# Patient Record
Sex: Male | Born: 1935 | Race: White | Hispanic: No | Smoking: Never smoker
Health system: Southern US, Community
[De-identification: ages and names within clinical notes are randomized; demographics above are authoritative.]

## PROBLEM LIST (undated history)

## (undated) DIAGNOSIS — E7800 Pure hypercholesterolemia, unspecified: Secondary | ICD-10-CM

## (undated) DIAGNOSIS — M7989 Other specified soft tissue disorders: Secondary | ICD-10-CM

## (undated) DIAGNOSIS — I10 Essential (primary) hypertension: Secondary | ICD-10-CM

## (undated) DIAGNOSIS — M1990 Unspecified osteoarthritis, unspecified site: Secondary | ICD-10-CM

## (undated) DIAGNOSIS — C419 Malignant neoplasm of bone and articular cartilage, unspecified: Secondary | ICD-10-CM

## (undated) DIAGNOSIS — I519 Heart disease, unspecified: Secondary | ICD-10-CM

## (undated) HISTORY — PX: CHOLECYSTECTOMY: SHX55

## (undated) HISTORY — DX: Unspecified osteoarthritis, unspecified site: M19.90

## (undated) HISTORY — DX: Essential (primary) hypertension: I10

## (undated) HISTORY — DX: Heart disease, unspecified: I51.9

## (undated) HISTORY — DX: Malignant neoplasm of bone and articular cartilage, unspecified: C41.9

## (undated) HISTORY — DX: Pure hypercholesterolemia, unspecified: E78.00

## (undated) HISTORY — DX: Other specified soft tissue disorders: M79.89

---

## 1999-06-28 ENCOUNTER — Inpatient Hospital Stay (HOSPITAL_COMMUNITY): Admission: AD | Admit: 1999-06-28 | Discharge: 1999-07-05 | Payer: Self-pay | Admitting: Internal Medicine

## 1999-06-28 ENCOUNTER — Encounter: Payer: Self-pay | Admitting: Internal Medicine

## 1999-09-29 ENCOUNTER — Ambulatory Visit (HOSPITAL_COMMUNITY): Admission: RE | Admit: 1999-09-29 | Discharge: 1999-09-30 | Payer: Self-pay | Admitting: Surgery

## 2000-09-11 ENCOUNTER — Ambulatory Visit (HOSPITAL_COMMUNITY): Admission: RE | Admit: 2000-09-11 | Discharge: 2000-09-11 | Payer: Self-pay | Admitting: *Deleted

## 2001-03-10 ENCOUNTER — Encounter: Payer: Self-pay | Admitting: Emergency Medicine

## 2001-03-10 ENCOUNTER — Emergency Department (HOSPITAL_COMMUNITY): Admission: EM | Admit: 2001-03-10 | Discharge: 2001-03-11 | Payer: Self-pay | Admitting: Emergency Medicine

## 2002-07-17 ENCOUNTER — Encounter: Payer: Self-pay | Admitting: Cardiology

## 2003-12-24 ENCOUNTER — Encounter: Admission: RE | Admit: 2003-12-24 | Discharge: 2003-12-24 | Payer: Self-pay | Admitting: Surgery

## 2003-12-29 ENCOUNTER — Ambulatory Visit (HOSPITAL_BASED_OUTPATIENT_CLINIC_OR_DEPARTMENT_OTHER): Admission: RE | Admit: 2003-12-29 | Discharge: 2003-12-29 | Payer: Self-pay | Admitting: Surgery

## 2003-12-29 ENCOUNTER — Encounter (INDEPENDENT_AMBULATORY_CARE_PROVIDER_SITE_OTHER): Payer: Self-pay | Admitting: *Deleted

## 2003-12-29 ENCOUNTER — Ambulatory Visit (HOSPITAL_COMMUNITY): Admission: RE | Admit: 2003-12-29 | Discharge: 2003-12-29 | Payer: Self-pay | Admitting: Surgery

## 2006-07-29 ENCOUNTER — Inpatient Hospital Stay (HOSPITAL_COMMUNITY): Admission: EM | Admit: 2006-07-29 | Discharge: 2006-08-06 | Payer: Self-pay | Admitting: Emergency Medicine

## 2006-08-06 ENCOUNTER — Encounter (INDEPENDENT_AMBULATORY_CARE_PROVIDER_SITE_OTHER): Payer: Self-pay | Admitting: Emergency Medicine

## 2006-08-06 ENCOUNTER — Ambulatory Visit: Payer: Self-pay | Admitting: Vascular Surgery

## 2006-09-20 ENCOUNTER — Ambulatory Visit (HOSPITAL_COMMUNITY): Admission: RE | Admit: 2006-09-20 | Discharge: 2006-09-21 | Payer: Self-pay | Admitting: Surgery

## 2006-09-20 ENCOUNTER — Encounter (INDEPENDENT_AMBULATORY_CARE_PROVIDER_SITE_OTHER): Payer: Self-pay | Admitting: Surgery

## 2006-09-21 ENCOUNTER — Ambulatory Visit: Payer: Self-pay | Admitting: Oncology

## 2007-11-21 ENCOUNTER — Encounter: Payer: Self-pay | Admitting: Cardiology

## 2008-07-20 ENCOUNTER — Encounter: Payer: Self-pay | Admitting: Cardiology

## 2008-11-16 ENCOUNTER — Ambulatory Visit: Payer: Self-pay | Admitting: Surgery

## 2009-04-06 ENCOUNTER — Encounter: Payer: Self-pay | Admitting: Cardiology

## 2009-06-07 ENCOUNTER — Telehealth (INDEPENDENT_AMBULATORY_CARE_PROVIDER_SITE_OTHER): Payer: Self-pay | Admitting: *Deleted

## 2009-06-07 ENCOUNTER — Encounter: Payer: Self-pay | Admitting: Cardiology

## 2009-07-01 ENCOUNTER — Ambulatory Visit: Payer: Self-pay | Admitting: Cardiology

## 2009-07-01 DIAGNOSIS — K219 Gastro-esophageal reflux disease without esophagitis: Secondary | ICD-10-CM | POA: Insufficient documentation

## 2009-07-01 DIAGNOSIS — I10 Essential (primary) hypertension: Secondary | ICD-10-CM | POA: Insufficient documentation

## 2009-07-01 DIAGNOSIS — E7800 Pure hypercholesterolemia, unspecified: Secondary | ICD-10-CM | POA: Insufficient documentation

## 2009-07-01 DIAGNOSIS — K819 Cholecystitis, unspecified: Secondary | ICD-10-CM | POA: Insufficient documentation

## 2009-07-01 DIAGNOSIS — I4891 Unspecified atrial fibrillation: Secondary | ICD-10-CM

## 2009-07-01 DIAGNOSIS — I2510 Atherosclerotic heart disease of native coronary artery without angina pectoris: Secondary | ICD-10-CM

## 2009-07-01 DIAGNOSIS — K449 Diaphragmatic hernia without obstruction or gangrene: Secondary | ICD-10-CM | POA: Insufficient documentation

## 2009-07-01 DIAGNOSIS — I219 Acute myocardial infarction, unspecified: Secondary | ICD-10-CM | POA: Insufficient documentation

## 2009-07-01 DIAGNOSIS — C61 Malignant neoplasm of prostate: Secondary | ICD-10-CM

## 2009-07-14 ENCOUNTER — Telehealth (INDEPENDENT_AMBULATORY_CARE_PROVIDER_SITE_OTHER): Payer: Self-pay | Admitting: *Deleted

## 2009-07-15 ENCOUNTER — Ambulatory Visit: Payer: Self-pay

## 2009-07-15 ENCOUNTER — Ambulatory Visit: Payer: Self-pay | Admitting: Internal Medicine

## 2009-07-15 ENCOUNTER — Encounter (HOSPITAL_COMMUNITY): Admission: RE | Admit: 2009-07-15 | Discharge: 2009-09-28 | Payer: Self-pay | Admitting: Cardiology

## 2009-12-13 ENCOUNTER — Ambulatory Visit: Payer: Self-pay | Admitting: Surgery

## 2010-02-10 ENCOUNTER — Encounter: Payer: Self-pay | Admitting: Cardiology

## 2010-02-10 ENCOUNTER — Ambulatory Visit: Payer: Self-pay | Admitting: Cardiology

## 2010-03-31 NOTE — Assessment & Plan Note (Signed)
Summary: PER WALK IN/PT MD IS RET./ arrived at 2p.m./ gd   History of Present Illness: 77 year old male with past medical history of coronary artery disease status post coronary artery bypass and graft, permanent atrial fibrillation for establishment. Previously followed by Dr. Lucas Mallow. Last echocardiogram performed in September of 2009 and interpreted by Dr. Lucas Mallow revealed normal LV function with an ejection fraction of 55-65%. There was mild tricuspid regurgitation with mildly elevated pulmonary pressures. There was moderate biatrial enlargement. There was mild right ventricular enlargement. The patient does have some fatigue with exertion. However he denies any dyspnea on exertion, orthopnea, PND, palpitations, syncope or chest pain. He has mild pedal edema. His cardiologist recently retired and he would like to be followed here.  Current Medications (verified): 1)  Metoprolol Succinate 50 Mg Xr24h-Tab (Metoprolol Succinate) .... 1/2 Tab By Mouth Two Times A Day 2)  Diltiazem Hcl Er Beads 120 Mg Xr24h-Cap (Diltiazem Hcl Er Beads) .Marland Kitchen.. 1 1/2 Tab By Mouth Once Daily 3)  Crestor 10 Mg Tabs (Rosuvastatin Calcium) .... Take One Tablet By Mouth Daily. 4)  Glucosamine-Chondroitin   Caps (Glucosamine-Chondroit-Vit C-Mn) .Marland Kitchen.. 1  Tab By Mouth Once Daily 5)  Metamucil 30.9 % Powd (Psyllium) .... As Needed 6)  Multivitamins   Tabs (Multiple Vitamin) .Marland Kitchen.. 1 Tab By Mouth Once Daily 7)  Vitamin C 500 Mg Tabs (Ascorbic Acid) .Marland Kitchen.. 1 Tab By Mouth Once Daily 8)  Fish Oil   Oil (Fish Oil) .Marland Kitchen.. 1 Tab By Mouth Once Daily 9)  Coumadin 5 Mg Tabs (Warfarin Sodium) .... As Directed  Past History:  Past Medical History: CAD (ICD-414.00) HYPERTENSION (ICD-401.9) HIATAL HERNIA (ICD-553.3) HYPERCHOLESTEROLEMIA (ICD-272.0) PROSTATE CANCER (ICD-185) GERD (ICD-530.81) Atrial fibrillation on chronic Coumadin.   Past Surgical History: Reviewed history from 07/01/2009 and no changes required.  Percutaneous  cholecystostomy.  CABG  grafting x 4 in 1996.  Prostatectomy.  Bilateral inguinal hernia repair  Family History: Reviewed history from 07/01/2009 and no changes required. 2 brothers died of MI in their 73's  Social History: Reviewed history from 07/01/2009 and no changes required.  Widowed, retired.   Has never smoked. Occasional ETOH  Review of Systems       Significant pain in left knee but no fevers or chills, productive cough, hemoptysis, dysphasia, odynophagia, melena, hematochezia, dysuria, hematuria, rash, seizure activity, orthopnea, PND,  claudication. Remaining systems are negative.   Vital Signs:  Patient profile:   77 year old male Weight:      193 pounds Pulse rate:   62 / minute Resp:     12 per minute BP sitting:   125 / 67  (left arm)  Vitals Entered By: Kem Parkinson (Jul 01, 2009 2:02 PM)  Physical Exam  General:  Well developed/well nourished in NAD Skin warm/dry Patient not depressed No peripheral clubbing Back-normal HEENT-normal/normal eyelids Neck supple/normal carotid upstroke bilaterally; no bruits; no JVD; no thyromegaly chest - CTA/ normal expansion CV - irregular/normal S1 and S2; no murmurs, rubs or gallops;  PMI nondisplaced Abdomen -NT/ND, no HSM, no mass, + bowel sounds, no bruit; Ventral hernia 2+ femoral pulses, no bruits Ext-trace edema, chords, 2+ DP; chronic skin changes and varicosities Neuro-grossly nonfocal     EKG  Procedure date:  07/01/2009  Findings:      Atrial fibrillation at a rate of 62. Axis normal. No ST changes.  Impression & Recommendations:  Problem # 1:  ATRIAL FIBRILLATION (ICD-427.31)  Continue beta blocker and calcium blocker for rate control. Continue Coumadin with  goal INR 2-3. Coumadin is monitored by his primary care physician. His updated medication list for this problem includes:    Metoprolol Succinate 50 Mg Xr24h-tab (Metoprolol succinate) .Marland Kitchen... 1/2 tab by mouth two times a day     Coumadin 5 Mg Tabs (Warfarin sodium) .Marland Kitchen... As directed    Aspirin 81 Mg Tbec (Aspirin) .Marland Kitchen... Take one tablet by mouth daily  His updated medication list for this problem includes:    Metoprolol Succinate 50 Mg Xr24h-tab (Metoprolol succinate) .Marland Kitchen... 1/2 tab by mouth two times a day    Coumadin 5 Mg Tabs (Warfarin sodium) .Marland Kitchen... As directed    Aspirin 81 Mg Tbec (Aspirin) .Marland Kitchen... Take one tablet by mouth daily  Problem # 2:  CAD (ICD-414.00) Continue beta blocker and statin. Add enteric-coated aspirin 81 mg p.o. daily. Schedule Myoview for risk stratification. His updated medication list for this problem includes:    Metoprolol Succinate 50 Mg Xr24h-tab (Metoprolol succinate) .Marland Kitchen... 1/2 tab by mouth two times a day    Diltiazem Hcl Er Beads 120 Mg Xr24h-cap (Diltiazem hcl er beads) .Marland Kitchen... 1 1/2 tab by mouth once daily    Coumadin 5 Mg Tabs (Warfarin sodium) .Marland Kitchen... As directed    Aspirin 81 Mg Tbec (Aspirin) .Marland Kitchen... Take one tablet by mouth daily  Orders: Nuclear Stress Test (Nuc Stress Test)  Problem # 3:  HYPERTENSION (ICD-401.9)  Blood pressure controlled on present medications. Will continue. His updated medication list for this problem includes:    Metoprolol Succinate 50 Mg Xr24h-tab (Metoprolol succinate) .Marland Kitchen... 1/2 tab by mouth two times a day    Diltiazem Hcl Er Beads 120 Mg Xr24h-cap (Diltiazem hcl er beads) .Marland Kitchen... 1 1/2 tab by mouth once daily    Aspirin 81 Mg Tbec (Aspirin) .Marland Kitchen... Take one tablet by mouth daily  His updated medication list for this problem includes:    Metoprolol Succinate 50 Mg Xr24h-tab (Metoprolol succinate) .Marland Kitchen... 1/2 tab by mouth two times a day    Diltiazem Hcl Er Beads 120 Mg Xr24h-cap (Diltiazem hcl er beads) .Marland Kitchen... 1 1/2 tab by mouth once daily    Aspirin 81 Mg Tbec (Aspirin) .Marland Kitchen... Take one tablet by mouth daily  Problem # 4:  HYPERCHOLESTEROLEMIA (ICD-272.0) Continue statin. Lipids and liver monitor by primary care. His updated medication list for this problem  includes:    Crestor 10 Mg Tabs (Rosuvastatin calcium) .Marland Kitchen... Take one tablet by mouth daily.  His updated medication list for this problem includes:    Crestor 10 Mg Tabs (Rosuvastatin calcium) .Marland Kitchen... Take one tablet by mouth daily.  Problem # 5:  GERD (ICD-530.81)  Patient Instructions: 1)  Your physician recommends that you schedule a follow-up appointment in: 6 MONTHS 2)  Your physician has recommended you make the following change in your medication: START ASPIRIN 81MG  ONE TABLET ONCE DAILY WITH FOOD 3)  Your physician has requested that you have an LEXISCAN stress myoview.  For further information please visit [email or web address]  Please follow instruction sheet, as given.

## 2010-03-31 NOTE — Progress Notes (Signed)
Summary: Nuclear Pre-Procedure  Phone Note Outgoing Call   Call placed by: Milana Na, EMT-P,  Jul 14, 2009 3:30 PM Summary of Call: Reviewed information on Myoview Information Sheet (see scanned document for further details).  Spoke with patient.     Nuclear Med Background Indications for Stress Test: Evaluation for Ischemia   History: CABG, Echo, Heart Catheterization   Symptoms: Fatigue    Nuclear Pre-Procedure Cardiac Risk Factors: Family History - CAD, Hypertension  Nuclear Med Study Referring MD:  B. Crenshaw

## 2010-03-31 NOTE — Letter (Signed)
Summary: [facility] Exercise Tolerance Test  [facility] Exercise Tolerance Test   Imported By: Roderic Ovens 07/12/2009 10:26:54  _____________________________________________________________________  External Attachment:    Type:   Image     Comment:   External Document

## 2010-03-31 NOTE — Progress Notes (Signed)
  Pt. Signed ROI,FAxed over to [facility] [phone]([phone]) New pt appt 5/9/2011w/ Teresa Pelton Mesiemore  June 07, 2009 11:56 AM    Appended Document:  Recieved Records back today from [facility] medical, sent to [employer]

## 2010-03-31 NOTE — Assessment & Plan Note (Signed)
Summary: Cardiology Nuclear Study  Nuclear Med Background Indications for Stress Test: Evaluation for Ischemia   History: CABG, Echo, Heart Catheterization   Symptoms: DOE, Fatigue, Fatigue with Exertion, SOB    Nuclear Pre-Procedure Cardiac Risk Factors: Family History - CAD, Hypertension Caffeine/Decaff Intake: none NPO After: 7:00 PM Lungs: clear IV 0.9% NS with Angio Cath: 20g     IV Site: (R) Ac IV Started by: Stanton Kidney EMT-P Chest Size (in) 40     Height (in): 68 Weight (lb): 188 BMI: 28.69 Tech Comments: Metoprolol held this am, per Patient.  Nuclear Med Study 1 or 2 day study:  1 day     Stress Test Type:  Eugenie Birks Reading MD:  Arvilla Meres, MD     Referring MD:  B. Crenshaw Resting Radionuclide:  Technetium 24m Tetrofosmin     Resting Radionuclide Dose:  11 mCi  Stress Radionuclide:  Technetium 93m Tetrofosmin     Stress Radionuclide Dose:  33 mCi   Stress Protocol   Lexiscan: 0.4 mg   Stress Test Technologist:  Milana Na EMT-P     Nuclear Technologist:  Domenic Polite CNMT  Rest Procedure  Myocardial perfusion imaging was performed at rest 45 minutes following the intravenous administration of Myoview Technetium 82m Tetrofosmin.  Stress Procedure  The patient received IV Lexiscan 0.4 mg over 15-seconds.  Myoview injected at 30-seconds.  There were no significant changes with infusion.  Quantitative spect images were obtained after a 45 minute delay.  QPS Raw Data Images:  Normal; no motion artifact; normal heart/lung ratio. Stress Images:  There is normal uptake in all areas. Rest Images:  Normal homogeneous uptake in all areas of the myocardium. Subtraction (SDS):  Normal Transient Ischemic Dilatation:  .97  (Normal <1.22)  Lung/Heart Ratio:  .39  (Normal <0.45)  Quantitative Gated Spect Images QGS cine images:  non-gated study due to AF  Findings Normal nuclear study      Overall Impression  Exercise Capacity: Lexiscan study  with no exercise. ECG Impression: Baseline: NSR; No significant ST segment change with Lexiscan. Overall Impression: Normal stress nuclear study. Overall Impression Comments: Normal perfusion. Not gated due to AF.  Appended Document: Cardiology Nuclear Study ok  Appended Document: Cardiology Nuclear Study pt aware of results

## 2010-03-31 NOTE — Letter (Signed)
Summary: [facility] Office Note  [facility] Office Note   Imported By: Roderic Ovens 07/12/2009 10:23:32  _____________________________________________________________________  External Attachment:    Type:   Image     Comment:   External Document

## 2010-03-31 NOTE — Assessment & Plan Note (Signed)
Summary: 6 month rov/sl   CC:  6 month check up.  History of Present Illness: 77 year old male with past medical history of coronary artery disease status post coronary artery bypass and graft, permanent atrial fibrillation for f/u. Previously followed by Dr. Lucas Mallow. Last echocardiogram performed in September of 2009 and interpreted by Dr. Lucas Mallow revealed normal LV function with an ejection fraction of 55-65%. There was mild tricuspid regurgitation with mildly elevated pulmonary pressures. There was moderate biatrial enlargement. There was mild right ventricular enlargement. Myoview in May of 2011 showed normal perfusion. Steady not gated. I last saw him in May of 2011. Since then, the patient denies any dyspnea on exertion, orthopnea, PND, pedal edema, palpitations, syncope or chest pain. He does have some fatigue.  Current Medications (verified): 1)  Metoprolol Succinate 50 Mg Xr24h-Tab (Metoprolol Succinate) .... 1/2 Tab By Mouth Two Times A Day 2)  Diltiazem Hcl Er Beads 120 Mg Xr24h-Cap (Diltiazem Hcl Er Beads) .... 1/2 Tab By Mouth Once Daily 3)  Crestor 10 Mg Tabs (Rosuvastatin Calcium) .... Take One Tablet By Mouth Daily. 4)  Glucosamine-Chondroitin   Caps (Glucosamine-Chondroit-Vit C-Mn) .... 2  Tab By Mouth Once Daily 5)  Multivitamins   Tabs (Multiple Vitamin) .Marland Kitchen.. 1 Tab By Mouth Once Daily 6)  Vitamin C 500 Mg Tabs (Ascorbic Acid) .Marland Kitchen.. 1 Tab By Mouth Once Daily 7)  Fish Oil   Oil (Fish Oil) .Marland Kitchen.. 1 Tab By Mouth Once Daily 8)  Coumadin 5 Mg Tabs (Warfarin Sodium) .... As Directed 9)  Aspirin 81 Mg Tbec (Aspirin) .... Take One Tablet By Mouth Daily 10)  Lipitor 10 Mg Tabs (Atorvastatin Calcium) .Marland Kitchen.. 1 Tab By Mouth Once Daily 11)  Preservision Areds  Caps (Multiple Vitamins-Minerals) .Marland Kitchen.. 1 Tab By Mouth Once Daily  Allergies: No Known Drug Allergies  Past History:  Past Medical History: Reviewed history from 07/01/2009 and no changes required. CAD (ICD-414.00) HYPERTENSION  (ICD-401.9) HIATAL HERNIA (ICD-553.3) HYPERCHOLESTEROLEMIA (ICD-272.0) PROSTATE CANCER (ICD-185) GERD (ICD-530.81) Atrial fibrillation on chronic Coumadin.   Past Surgical History: Reviewed history from 07/01/2009 and no changes required.  Percutaneous cholecystostomy.  CABG  grafting x 4 in 1996.  Prostatectomy.  Bilateral inguinal hernia repair  Social History: Reviewed history from 07/01/2009 and no changes required.  Widowed, retired.   Has never smoked. Occasional ETOH  Review of Systems       Pain in left knee, fatigue and decreased hearing but no fevers or chills, productive cough, hemoptysis, dysphasia, odynophagia, melena, hematochezia, dysuria, hematuria, rash, seizure activity, orthopnea, PND, pedal edema, claudication. Remaining systems are negative.   Vital Signs:  Patient profile:   77 year old male Height:      68 inches Weight:      190 pounds BMI:     28.99 Pulse rate:   76 / minute Resp:     14 per minute BP sitting:   138 / 75  (left arm)  Vitals Entered By: Kem Parkinson (February 10, 2010 12:22 PM)  Physical Exam  General:  Well-developed well-nourished in no acute distress.  Skin is warm and dry.  HEENT is normal.  Neck is supple. No thyromegaly.  Chest is clear to auscultation with normal expansion.  Cardiovascular exam is irregular Abdominal exam nontender or distended. No masses palpated. Extremities show no edema. neuro grossly intact    EKG  Procedure date:  02/10/2010  Findings:      Atrial fibrillation, RV conduction delay, nonspecific ST changes.  Impression & Recommendations:  Problem #  1:  ATRIAL FIBRILLATION (ICD-427.31) Heart rate controlled on present medications. Will continue. Continue Coumadin. His updated medication list for this problem includes:    Metoprolol Succinate 50 Mg Xr24h-tab (Metoprolol succinate) .Marland Kitchen... 1/2 tab by mouth two times a day    Coumadin 5 Mg Tabs (Warfarin sodium) .Marland Kitchen... As directed     Aspirin 81 Mg Tbec (Aspirin) .Marland Kitchen... Take one tablet by mouth daily  Orders: EKG w/ Interpretation (93000)  Problem # 2:  CAD (ICD-414.00) Continue aspirin, beta blocker and statin. Last Myoview showed normal perfusion. His updated medication list for this problem includes:    Metoprolol Succinate 50 Mg Xr24h-tab (Metoprolol succinate) .Marland Kitchen... 1/2 tab by mouth two times a day    Diltiazem Hcl Er Beads 120 Mg Xr24h-cap (Diltiazem hcl er beads) .Marland Kitchen... 1/2 tab by mouth once daily    Coumadin 5 Mg Tabs (Warfarin sodium) .Marland Kitchen... As directed    Aspirin 81 Mg Tbec (Aspirin) .Marland Kitchen... Take one tablet by mouth daily  Problem # 3:  HYPERTENSION (ICD-401.9) Blood pressure controlled on present medications. Will continue. His updated medication list for this problem includes:    Metoprolol Succinate 50 Mg Xr24h-tab (Metoprolol succinate) .Marland Kitchen... 1/2 tab by mouth two times a day    Diltiazem Hcl Er Beads 120 Mg Xr24h-cap (Diltiazem hcl er beads) .Marland Kitchen... 1/2 tab by mouth once daily    Aspirin 81 Mg Tbec (Aspirin) .Marland Kitchen... Take one tablet by mouth daily  Problem # 4:  HYPERCHOLESTEROLEMIA (ICD-272.0) Patient will continue Crestor until his present supply in this. He then will begin Lipitor for financial reasons. Lipids and liver are followed by primary care. His updated medication list for this problem includes:    Crestor 10 Mg Tabs (Rosuvastatin calcium) .Marland Kitchen... Take one tablet by mouth daily.    Lipitor 10 Mg Tabs (Atorvastatin calcium) .Marland Kitchen... 1 tab by mouth once daily  Problem # 5:  GERD (ICD-530.81)  Patient Instructions: 1)  Your physician recommends that you schedule a follow-up appointment in: 1 year with Dr.Crenshaw 2)  Your physician recommends that you continue on your current medications as directed. Please refer to the Current Medication list given to you today.   Vital Signs:  Patient profile:   77 year old male Height:      68 inches Weight:      190 pounds BMI:     28.99 Pulse rate:   76 /  minute Resp:     14 per minute BP sitting:   138 / 75  (left arm)  Vitals Entered By: Kem Parkinson (February 10, 2010 12:22 PM)

## 2010-07-12 NOTE — Assessment & Plan Note (Signed)
OFFICE VISIT   TRINITY, HYLAND  DOB:  August 02, 1915                                       11/16/2008  ZOXWR#:[id number]   REASON FOR VISIT:  Evaluate aneurysm.   REFERRING PHYSICIAN:  Dr. Merri Brunette.   HISTORY:  This is a 77 year old gentleman I am seeing at the request of  Dr. Renne Crigler for evaluation of abdominal aortic aneurysm.  His aneurysm is  asymptomatic.  It was diagnosed by ultrasound.  Maximum diameter is 3.7  cm.  He denies having any symptoms, specifically no abdominal pain and  back pain.   REVIEW OF SYSTEMS:  GENERAL:  Negative for fevers, chills, weight gain,  weight loss.  CARDIAC:  Negative.  GI:  Negative.  GU:  Negative.  VASCULAR:  Positive for pain in his legs while walking.  Neuro is  negative.  Ortho is positive for left knee pain.  Psych:  Negative.  ENT:  Does not hear well.  HEME:  Negative.   PAST MEDICAL HISTORY:  Status post cholecystectomy, status post basal  cell cancer removal, status post coronary artery bypass graft by Dr.  Andrey Campanile.   FAMILY HISTORY:  Positive early cardiovascular disease in his brother.   SOCIAL HISTORY:  Widowed, retired.  Does not smoke.  Has never smoked.  Does not drink.   Medications include metoprolol, Cardizem, Diltiazem, Crestor, Coumadin,  Centrum Silver, glucosamine chondroitin, omega 3 fish oil.   ALLERGIES:  None.   PHYSICAL EXAMINATION:  Blood pressures 129/71, pulse 99.  GENERAL:  Well-  appearing, no distress.  HEENT:  Normocephalic, atraumatic.  Pupils  equal.  Sclerae anicteric.  NECK:  Supple.  No JVD.  No carotid bruits.  CARDIOVASCULAR:  Regular rate and rhythm.  No murmurs, rubs, or gallops.  PULMONARY:  Lungs are clear bilaterally.  ABDOMEN:  Soft, nontender.  No  hepatosplenomegaly.  EXTREMITIES:  Well perfused.  Neurologically, he is  intact.  PSYCH:  He is alert and oriented x3.  SKIN:  Without rash.   DIAGNOSTIC STUDIES:  Ultrasound from [facility]  revealed an abdominal aneurysm, maximum diameter of 3.7 cm.  The iliac  arteries are normal in caliber.   ASSESSMENT:  Asymptomatic abdominal aneurysm.   PLAN:  Based on the size of the patient's aneurysm, I would recommend  observation at this time.  His risk of rupture is extremely low.  This  was reiterated to the patient.  I am recommending followup in 1 year  with a repeat ultrasound.   Jorge Ny, MD  Electronically Signed   VWB/MEDQ  D:  11/16/2008  T:  11/17/2008  Job:  [id number]   cc:   Soyla Murphy. Renne Crigler, M.D.  Antionette Char, MD

## 2010-07-12 NOTE — H&P (Signed)
NAMEMARSELINO, [name] Chang NO.:  [id number]   MEDICAL RECORD NO.:  [id number]          PATIENT TYPE:  INP   LOCATION:  1824                         FACILITY:  [facility]   PHYSICIAN:  Maisie Fus A. Cornett, M.D.DATE OF BIRTH:  Jul 23, 1915   DATE OF ADMISSION:  07/29/2006  DATE OF DISCHARGE:                              HISTORY & PHYSICAL   CHIEF COMPLAINT:  Right upper quadrant pain.   HISTORY OF PRESENT ILLNESS:  The patient is an 77 year old male with a 2-  day history of right upper quadrant pain.  The pain is sharp in nature,  associated with nausea, vomiting and getting worse.  He has a history of  coronary artery disease status post CABG, atrial fibrillation with  chronic Coumadin therapy for his atrial fibrillation, gastroesophageal  reflux disease, history of prostate cancer status post prostatectomy and  has had a history of myocardial infarction.  The pain is located in the  right upper quadrant and is 10/10 in severity.  There is no radiation.  It is sharp in nature, and pain medicine makes it better.  He is also  complaining of shortness of breath but no chest pain.   PAST MEDICAL HISTORY:  1. Gastroesophageal reflux disease.  2. Prostate cancer.  3. Atrial fibrillation on chronic Coumadin.  4. History of coronary artery disease status post CABG.  5. Myocardial infarction history.   PAST SURGICAL HISTORY:  1. CABG.  2. Prostatectomy.  3. Bilateral inguinal hernia repair.   MEDICATIONS:  Include Cardizem, Coumadin, Crestor, Prilosec, and  metoprolol.   ALLERGIES:  None.   FAMILY HISTORY:  Positive for coronary artery disease.   SOCIAL HISTORY:  Occasional alcohol use.  Denies any recent tobacco use  or drug use.   REVIEW OF SYSTEMS:  A 15-point Review of Systems as stated above.  He  does have shortness of breath which is mild in nature.  Otherwise, the  remainder of the 15 are negative.   PHYSICAL EXAMINATION:  VITAL SIGNS:  Temperature 100.7,   blood pressure  136/88, heart rate 133-118.  Respiratory rate is 20.  Saturation 100%.  HEENT: Extraocular movements are intact.  No evidence of scleral  icterus.  NECK: Supple, nontender without mass.  Trachea midline.  CHEST:  Lung sounds are clear bilaterally.  Chest wall motion is normal.  CARDIOVASCULAR: Irregular rate and rhythm with tachycardia.  Extremities  are warm, well-perfused.  ABDOMEN:  Positive Murphy's sign with some fullness in his right upper  quadrant.  No diffuse peritonitis.  No mass.  No hernia.  EXTREMITIES:  Multiple varicose veins and staining from venous stasis  noted.  No swelling.  No ulceration.  Muscle tone is decreased.  NEUROLOGIC:  Motor and sensory functions are grossly intact.  Glasgow  Coma Scale is 15.  PSYCHIATRIC:  Mood is normal. Affect is normal.  He is pleasant.  SKIN:  Multiple areas of tissue staining noted in both lower extremities  secondary to chronic venous stasis disease and varicose veins.   DIAGNOSTIC STUDIES:  Abdominal CT scan reviewed and pelvic CT scan  reviewed.  There is thickened gallbladder with inflammation consistent  with acute cholecystitis.  He does have a 3.5 cm abdominal aortic  aneurysm and a 3 cm right common iliac artery aneurysm.  These appear to  be non-expanding.  No free fluid or free air noted.   Laboratory studies show white count of 17,700, hemoglobin 14.7, platelet  count 96,000.  His PT is 23, INR 2.0. AST is 20, ALT 16,  lipase 20.  Bilirubin total is 2.5. Bilirubin direct is 0.8.  Bilirubin indirect  1.7.   EKG shows atrial fibrillation with rapid ventricular response.   ASSESSMENT/PLAN:  1. Acute cholecystitis.  2. Atrial fibrillation with rapid ventricular response.  3. History of coronary artery disease status post coronary artery      bypass grafting and previous myocardial infarction.  4. History of prostate cancer.  5. Bilateral hernia.  6. Hyperbilirubinemia with concern for possible common  bile duct      stone.   PLAN:  He will be admitted to step-down with medical consultation for  his atrial fibrillation and other medical issues.  He will need to have  his INR drift down to 2.0.  The may need an ERCP, but another option  would be to do a laparoscopic cholecystectomy with cholangiogram on him  and defer ERCP.  I will recheck his liver functions in the morning.  He  will be on doctor-of-the-wee service with Dr. Michaell Cowing taking over  tomorrow.  We will tentatively scan him for surgery in the morning  depending on what is INR is and how he is doing.  We will place him on  Cipro and IV fluids and make him n.p.o. temporarily.      Thomas A. Cornett, M.D.  Electronically Signed     TAC/MEDQ  D:  07/29/2006  T:  07/29/2006  Job:  [id number]

## 2010-07-12 NOTE — Procedures (Signed)
DUPLEX ULTRASOUND OF ABDOMINAL AORTA   INDICATION:  Follow up AAA.   HISTORY:  Diabetes:  No.  Cardiac:  No.  Hypertension:  Yes.  Smoking:  No.  Connective Tissue Disorder:  Family History:  Cardiovascular disease, brother.  Previous Surgery:  No.   DUPLEX EXAM:         AP (cm)                   TRANSVERSE (cm)  Proximal             3.72 cm                   3.23 cm  Mid                  3.56 cm                   3.33 cm  Distal               3.14 cm                   2.89 cm  Right Iliac          1.57 cm  Left Iliac           1.28 cm   PREVIOUS:  Date:  AP:  3.7  TRANSVERSE:   IMPRESSION:  Abdominal aortic aneurysm noted today with largest  measurement of 3.72 cm X 3.23 cm.   ___________________________________________  V. Charlena Cross, MD   EM/MEDQ  D:  12/13/2009  T:  12/13/2009  Job:  [id number]

## 2010-07-12 NOTE — Consult Note (Signed)
[name] Chang, [name] Chang                 ACCOUNT NO.:  [id number]   MEDICAL RECORD NO.:  [id number]          PATIENT TYPE:  OIB   LOCATION:  5737                         FACILITY:  [facility]   PHYSICIAN:  Lennis P. Darrold Span, M.D.DATE OF BIRTH:  [birth year]   DATE OF CONSULTATION:  09/21/2006  DATE OF DISCHARGE:  09/21/2006                                 CONSULTATION   The patient is a delightful 77 year old gentleman seen in consultation  in the hospital at [facility] at the request of Dr. Aggie Cosier, for  question of Heparin induced thrombocytopenia.  History is from the  patient and his daughter here now, hospital records including June 2008  hospitalization and previous lab work from Dr. Pollie Friar records.  His  primary physician is Dr. Hart Carwin and surgeon, Dr. Karie Soda.  The  patient's present admission was for a laparoscopic cholecystectomy, this  elective, following acute cholecystitis in early June 2008 when the  patient was fully anticoagulated and went into a rapid ventricular rate  then respiratory failure which precluded surgery.  A cholecystostomy  tube was kept in place until this surgery.  His past history is  significant for atrial fibrillation on Coumadin for the past year, this  monitored by the pharmacist for Drs. Lucas Mallow and [employer].  The patient has  been maintained on Coumadin other than receiving Lovenox during his last  hospitalization from June 3 through discharge on June 9 and for about a  week following that discharge until his Coumadin was again therapeutic  as well as Lovenox for several days prior to this admission with  Coumadin held.  He has had no bleeding out of the hospital in the last  several months and less than 50 mL blood loss with the laparoscopic  cholecystectomy on September 20, 2006.  He is ready for discharge home today  with the exception of slightly low platelet count and concern about his  anticoagulation.   PREVIOUS LABS:  March 2006, platelets  141,000.  March 2007, platelets  131,000.  June 01, 2006, platelets 108,000, the patient having had no  recent surgeries or hospitalizations and on Coumadin (no Lovenox) at  this lab check.  July 29, 2006, platelets 96,000 on the day of his  admission for the acute cholecystitis. July 31, 2006, platelets 107,000  with Lovenox begun.  August 05, 2006, platelets 177,000 still on Lovenox.  September 20, 2006, platelets 78,000 and PT 15, INR 1.2 on this admission.  September 21, 2006, platelets 98,000.   The patient has had no symptoms of arterial thrombosis or other blood  clots.   CT of the abdomen July 29, 2006, spleen unremarkable.   Peripheral smear reviewed now platelets are decreased but otherwise no  obvious morphologic abnormalities.  No fragmented cells suggesting DIC.   PHYSICAL EXAMINATION:  GENERAL:  He is alert, fully oriented,  appropriated, talkative and a good historian.  He appears comfortable,  fully dressed in the chair.  VITAL SIGNS:  Temperature 99.5, heart rate 107, respirations 20, blood  pressure 108/63, 92% saturated on room air.  LUNGS:  Clear.  HEART:  Irregularly irregular.  He has gauze dressings over 3 sites from  the laparoscopic cholecystectomy which have minimal serosanguineous  fluid and, per the patient, have not been changed now in about 16 hours.  No bleeding at the normal saline lock in his left arm.  ABDOMEN:  Soft.  He has some venous stasis changes on his lower legs and  some scattered bruising on his upper extremities.  He does not have  petechiae that I can tell, though I cannot really see this on lower  extremities with the venous stasis changes.   Other labs from July 25, white count 6.3, hemoglobin 12.5, creatinine  0.7, alk phos 58, SGOT 79, SGPT 49, albumin 2.9.   IMPRESSION/RECOMMENDATION:  Mild thrombocytopenia which does not  correlate with the Lovenox administration and I do not think is Heparin  induced thrombocytopenia.  Etiology of this is not  clear now, but it is  not an acute findings.  It seems reasonable to proceed with the Lovenox  in conversion back to Coumadin with very close monitoring as is planned  (he is scheduled for PT and CBC by the pharmacist on September 24, 2006).  His daughter knows to hold Lovenox and call if any other bleeding over  the weekend.  If the patient's other MDs need further evaluation of the  thrombocytopenia after discharge, I would be glad to see him back, but  as of now, I have not given him a return appointment to my office.   Thank you for the consultation.      Lennis P. Darrold Span, M.D.  Electronically Signed     LPL/MEDQ  D:  09/21/2006  T:  09/22/2006  Job:  [id number]   cc:   Jaclyn Prime. Lucas Mallow, M.D.  Soyla Murphy. Renne Crigler, M.D.

## 2010-07-12 NOTE — Consult Note (Signed)
NAMEERIKSON, Chang NO.:  [id number]   MEDICAL RECORD NO.:  [id number]          PATIENT TYPE:  EMS   LOCATION:  MAJO                         FACILITY:  [facility]   PHYSICIAN:  Lonia Blood, M.D.DATE OF BIRTH:  Aug 07, 1915   DATE OF CONSULTATION:  07/29/2006  DATE OF DISCHARGE:                                 CONSULTATION   PRIMARY CARE PHYSICIAN:  Soyla Murphy. Renne Crigler, M.D.   ATTENDING PHYSICIAN:  Clovis Pu. Cornett, M.D., [facility].   REASON FOR CONSULTATION:  Uncontrolled atrial fibrillation in a patient  with acute cholecystitis.   HISTORY OF PRESENT ILLNESS:  Mr. Chang Chang is an 77 year old  gentleman who presents to the emergency room with onset of abdominal  pain associated with nausea for approximately 48 hours.  Full ER  evaluation has resulted in a diagnosis of acute cholecystitis.  The  patient is being admitted to the general surgery service.  During his  stay in the emergency room, however, uncontrolled ventricular response  rate with the patient's baseline, chronic atrial fibrillation has been  appreciated.  Two 10 mg doses of Cardizem have significantly controlled  the patient's heart rate but with time this effect fades and the patient  develops rapid ventricular response again.  We are consulted to help  manage the patient's atrial fibrillation and attend to his other medical  issues during his hospital stay.   At the present time, the patient is resting comfortably  in his hospital  stretcher.  His abdominal pain is improved significantly with morphine.  He has no chest pain whatsoever.  Prior to his acute cholecystitis  attack, he was ambulating without difficulty and in fact, exercised two  times a week.  There has been no dyspnea on exertion or exercise  intolerance.  The patient's atrial fibrillation has been stable for  many, many years and the patient continues to take his Coumadin  therapy without difficulty.  He has  not taken his Coumadin in  approximately two days but his INR remains elevated.   REVIEW OF SYSTEMS:  Comprehensive review of systems is unremarkable with  the exception of positive elements noted in the history of present  illness above.   PAST MEDICAL HISTORY:  1. Coronary artery disease status post coronary artery bypass grafting      x4 in 1996.  2. Prostate cancer, status post radical prostatectomy in 1995.  3. Hypertension.  4. Hypercholesterolemia.  5. Hiatal hernia.  6. Bilateral incarcerated inguinal hernias, status post surgical      correction 2001, complicated by entrapped testicle requiring      unilateral orchiectomy.  7. Chronic atrial fibrillation on chronic Coumadin.   OUTPATIENT MEDICATIONS:  1. Cardizem 180 mg daily.  2. Coumadin 5 mg daily.  3. Crestor 10 mg daily.  4. Prilosec over-the-counter daily.  5. Metoprolol 25 mg b.i.d.   ALLERGIES:  NO KNOWN DRUG ALLERGIES.   FAMILY HISTORY:  Reviewed per old records but not contributory to this  admission.   SOCIAL HISTORY:  Patient is a widower.  He lives independently in town  but has a daughter who helps tend to his needs.  He does not smoke.  He  occasionally partakes of alcohol.  He is retired.   DATA REVIEWED:  Temperature 100.7, blood pressure 147/76, heart rate  133, respiratory rate 20, O2 saturation 97% on 3 liters per minute nasal  cannula.   White count 18,000, platelet count 96, hemoglobin 14.7 with MCV 92.  BMET is wholly unremarkable with the exception of elevated serum glucose  at 160.  INR 2.  Urinalysis is unremarkable except for ketones. But does  not reveal evidence of infection.   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  Chest x-ray reveals no acute disease.   PHYSICAL EXAMINATION:  GENERAL APPEARANCE:  Well-developed, well-  nourished male in no acute  respiratory.  VITAL SIGNS:  As noted above.  HEENT:  Normocephalic, atraumatic.  Pupils are equal, round, reactive to  light and accommodation.  Extraocular muscles intact bilaterally.  OC/OP  clear.  NECK:  No JVD, no lymphadenopathy.  LUNGS:  Clear to auscultation bilaterally without wheezes or rhonchi.  CARDIOVASCULAR:  Irregularly irregular with heart rate approximately 110  beats per minute at the present time without appreciable murmurs, rubs,  or gallops.  ABDOMEN:  Tender to palpation diffusely but much worse in the right  upper quadrant.  Positive Murphy's sign.  Bowel sounds are hypoactive.  There is no rebound.  There is no ascites.  There is no appreciable  mass.  EXTREMITIES:  No significant clubbing, cyanosis, or edema of bilateral  lower extremities.  NEUROLOGIC:  Patient is extremely hard of hearing.  Cranial nerves II-  XII intact bilaterally otherwise.  There is 5/5 strength bilateral upper  and lower extremities.  He has intact sensation to touch throughout.  There is no Babinski's.   RECOMMENDATIONS:  1. Acute cholecystitis - as per primary service.  2. Atrial fibrillation - the patient's atrial fibrillation is      extremely well controlled on his chronic baseline medicines per his      report.  It is likely that his current rapid ventricular response      is simply a reaction to his acute inflammatory illness, namely his      acute cholecystitis.  For the present time, given that his p.o.      intake will be unreliable and inconsistent, we will dose him with      IV Cardizem drip as well as IV beta-blocker.  We will follow his      rate very closely.  I anticipate that his rate will be quite easy      to control once his cholecystitis is attended to.  See coagulation      issues below  3. 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  For now I will load him with IV dose of vitamin K.  Will     type and cross and hold fresh  frozen plasma.  If surgery desires to      proceed with cholecystectomy tonight, I would recommend rechecking      INR and transfusing at least two units of fresh frozen plasma if      INR is not yet corrected.  We will resume anticoagulation in the      postoperative period as rapidly as is feasible.  4.  Hypertension.  As patient's blood pressure is currently well      controlled, blood pressure should be easily managed on a Cardizem      drip and IV Lopressor.  5. Hypercholesterolemia.  We will hold Crestor in the present      situation.  6. Hyperglycemia - patient does not have a history of diabetes      mellitus.  We will check a hemoglobin A1c with his next blood      check.  Anticipate, however, that his blood sugar will normalize      after his acute inflammatory state/infection is corrected.  7. Abdominal aortic aneurysm - it is not clear whether this is a known      chronic issue or a new finding.  There did not appear to be any      acute complications and we will simply monitor this for now.  8. A 3 cm right common iliac artery aneurysm - clinically the patient      is stable from this finding.  It is not clear if this is a chronic      problem or not.  We will follow this during his hospital stay.      Lonia Blood, M.D.  Electronically Signed     JTM/MEDQ  D:  07/29/2006  T:  07/29/2006  Job:  [id number]

## 2010-07-12 NOTE — Op Note (Signed)
NAMEALEXIS, [name] Chang NO.:  [id number]   MEDICAL RECORD NO.:  [id number]          PATIENT TYPE:  OIB   LOCATION:  5737                         FACILITY:  [facility]   PHYSICIAN:  Ardeth Sportsman, MD     DATE OF BIRTH:  May 24, 1915   DATE OF PROCEDURE:  DATE OF DISCHARGE:                               OPERATIVE REPORT   PRIMARY CARE PHYSICIAN:  Soyla Murphy. Renne Crigler, M.D.   SURGEON:  Karie Soda, M.D.   ASSISTANT:  Chevis Pretty M.D.   PREOPERATIVE DIAGNOSIS:  Acute cholecystitis status post percutaneous  cholecystostomy tube.   POSTOPERATIVE DIAGNOSIS:  Acute cholecystitis status post percutaneous  cholecystostomy tube.   PROCEDURE PERFORMED:  1. Laparoscopic cholecystectomy with intraoperative cholangiogram.  2. Laparoscopic lysis of adhesions times 20 minutes equals one-third      of the case.   ANESTHESIA:  1. General anesthesia.  2. Local anesthetic and field block around all port sites.   SPECIMENS:  Gallbladder.   DRAINS:  None.   ESTIMATED BLOOD LOSS:  Less than 50 mL.   COMPLICATIONS:  No major complications.   INDICATION:  Mr. Banks is an 77 year old gentleman who presented with  acute cholecystitis and required urgent cholecystostomy tube placement  on July 30, 2006 when he was fully anticoagulation and went into rapid  ventricular rate and respiratory failure.  He has recovered from that  but and stabilized with the cholecystostomy tube.  To prevent another  attack, options were discussed.  The patient wished to prevent another  severe attack like this and wished to undergo surgical approach.  The  anatomy and physiology of hepatobiliary and pancreatic function was  explained.  Pathophysiology of cholecystolithiasis with its risks of  cholecystitis and recurrent attacks of cholecystitis and chronic  cholecystitis was discussed.  The risk of gallstone pancreatitis,  choledocholithiasis and other risks were discussed.  Options were  rediscussed and  recommendations made for laparoscopic cholecystectomy  with intraoperative cholangiogram.   The risks such as stroke, heart attack, deep venous thrombosis,  pulmonary embolism and death were discussed.  Risks such as bleeding,  need for transfusion, wound infection, abscess, injury to other organs,  prolonged pain, bile duct injuries resulting in the need of operative  reconstruction, percutaneous drainage, endoscopic extension was  discussed as well.  Questions answered, he and his family agreed to  proceed.   OPERATIVE FINDINGS:  He had a thickened gallbladder consistent with  cholecystitis.  He had omental adhesions from his prior percutaneous  cholecystostomy tube placement.  His cholangiogram showed normal biliary  anatomy with no abnormalities.   DESCRIPTION OF PROCEDURE:  Informed consent was confirmed.  The patient  underwent general anesthesia without any difficulties.  He received a  gram of IV cefoxitin given his cardiac history and having a drain in  place to help minimize risk of wound infection.  He had sequential  compression devices active during the entire case.  He had Coumadin held  and a preoperative INR of 1.2 on day of surgery.  He was positioned  supine.  He underwent general anesthesia without  difficulty.  He had  voided just prior to going to the operating room.  His head was  positioned supine with both arms tucked.  His percutaneous pigtail drain  was removed just prior to surgery.   Entry was gained in the abdomen with the patient using optical entry  with a 5-mm/0 degree scope with the patient in the steep reverse  Trendelenburg and right side up.  Camera inspection revealed no  intraabdominal injury.  Under direct visualization, 5-mm ports were  placed through the umbilicus and the right flank after lysis of  adhesions.  A 10-mm port was toggled through the subxiphoid incision  around the falciform ligament.   Omental adhesions to the anterior  abdominal wall and the gallbladder  were freed off using sharp as well as controlled cautery.  The  gallbladder fundus was able to be elevated cephalad.  The peritoneal  covering between the gallbladder on the anteromedial and posterolateral  aspects were freed on the anteromedial and posterolateral aspects.  Circumferential dissection was done to free the proximal two-thirds of  the gallbladder off the liver bed.  The gallbladder tapered down just  very subtly.  There was no classic infundibulum.  His cystic structures  were very densely adherent to each other.  During dissection, there was  some bleeding of the cystic artery but ultimately after skeletonization,  this was well controlled.  This was where most of the bleeding took  place and hemostasis was controlled at the end of this.  Further  skeletonization was done to confirm the cystic artery had been ligated.  Two clips on cystic duct side and one clip on the gallbladder side were  performed and arterial transection was completed.  This left one  structure going from the infundibulum of the gallbladder down to the  porta hepatis consisting of the cystic duct.  The two clips on the  infundibulum were placed and a partial cystic ductotomy was performed.  The cystic duct wall and the infundibulum was very thick. The cystic  duct was very narrow.  A 5-French cholangiogram catheter was passed  through a stab incision, and the right subcostal region flushed and  ultimately able to pass through the cystic duct.   A cholangiogram was run using diluted radiopaque contrast and continuous  fluoroscopy.  The contrast flowed well from the side branch consistent  with cystic cannulation into the common hepatic duct.  Contrast flowed  well into the right and left intrahepatic chains and down in common  hepatic duct to the common bile duct, across the ampulla and into a  normal duodenum.  This was consistent with a normal cholangiogram.  The   catheter was removed.  Three clips were placed on the cystic duct stump  and because it was a rather thickened, inflamed stump, I placed a 0 PDS  Endoloop on the cystic duct stump after cystic duct transection had been  completed.  The patient had a long cystic duct so I thought it was safe  to do this and care was made to stay away from the cystic duct/common  bile duct junction.   The gallbladder was freed off from its remaining attachments on the  liver bed and removed through the  subxiphoid port inside an EndoCatch  bag.  The fascial defect a the subxiphoid region was approximated using  0 Vicryl using a laparoscopic suture passer under direct visualization.  Copious irrigation over 4 liters was done.  There were some small oozers  on the omentum and liver that were ultimately controlled with cautery  and careful inspection.  Upper three abdominal ports were removed.  There was no bleeding on the peritoneal skin side.  Capnoperitoneum was  evacuated.  The umbilical port was removed.  A fascial stitch in the  subxiphoid region was tied down.  Skin was closed using 4-0 Monocryl  stitch.  Sterile dressing was applied.  The patient was extubated, sent  to recovery room in stable condition.   I explained the operative findings to the patient's family.  Questions  answered and they expressed understanding and appreciation.      Ardeth Sportsman, MD  Electronically Signed     SCG/MEDQ  D:  09/20/2006  T:  09/20/2006  Job:  [id number]   cc:   Soyla Murphy. Renne Crigler, M.D.  Jaclyn Prime. Lucas Mallow, M.D.  Ethelene Browns __________

## 2010-07-15 NOTE — H&P (Signed)
[place]  Patient:    [name] Chang, [name] Chang                          MRN: [id number] Adm. Date:  06/28/99 Attending:  Soyla Murphy. Renne Crigler, M.D. Dictator:   Rickard Patience, P.A.                         History and Physical  DATE OF BIRTH:  12/31/1915  CHIEF COMPLAINT:  Nausea, vomiting, dehydration.  HISTORY OF PRESENT ILLNESS:  [name] Chang is an 77 year old widowed patient of Dr. Soyla Murphy. Chang who comes in today for a sudden onset of vomiting yesterday evening about 6 p.m. after a dinner of chicken soup and peaches.  He has had repeated episodes of greenish-yellowish emesis with no evidence of any hematemesis. He has had some mild abdominal discomfort with this.  He has no history of any colon cancer or any gastroesophageal problems except for a hiatal hernia.  He has had no urination today.  He has had no bowel movements since Sunday.  This morning he did drink a Sprite, but when he was in the office he began vomiting again.  PAST MEDICAL/SURGICAL HISTORY: 1. Significant for coronary artery disease, status post coronary artery bypass    grafting x 4 in 1996. 2. He has prostate cancer, status post radical prostatectomy in March 1995. 3. Hypertension. 4. Hypercholesterolemia. 5. Hiatal hernia. 6. Mild left ventricular hypertrophy.  FAMILY HISTORY:  Mother decreased at age 67 from food poisoning.  Father deceased of stomach trouble at age 32, unknown cause.  One sister with hypertension and emphysema.  One brother and sister have both had coronary artery disease, and are status post myocardial infarction.  He has two children alive and well, one of hem with asthma.  SOCIAL HISTORY:  He is a native of [place].  No tobacco use.  He drinks several glasses or wine a week.  He walks regularly on a treadmill.  He is a Control and instrumentation engineer.  CONSULTATION: 1. Dr. Helene Chang, rheumatology. 2. Dr. Angelia Chang. [name] Chang, surgery. 3. Dr. Excell Chang. [name] Chang,  urology.  PHYSICAL EXAMINATION:  VITAL SIGNS:  Weight today is not obtained.  The patient refuses, due to weakness. Blood pressure 124/84, pulse 100 sitting, respirations 20, temperature 97.4 degrees.  GENERAL:  The patient is an ill-appearing 77 year old white male.  SKIN:  He is slightly pale, dry, flaky, with no lesions noted.  NODES:  He has no cervical, supraclavicular, or inguinal adenopathy.  HEENT:  Oropharynx moist and clear.  NECK:  Nontender, with no thyromegaly noted.  LUNGS:  Clear to auscultation bilaterally, without rales, rhonchi, or wheezing. Respirations 20 with the patient sitting.  CARDIOVASCULAR:  He is slightly tachycardic, and bounding, with the pulse of about 100, with him sitting. No obvious murmur, rub, or gallop is heard.  ABDOMEN:  He does have good bowel sounds throughout.  He is soft and perhaps slightly distended, tender in the epigastric and left lower quadrant, with no masses, rebound, or guarding noted.  RECTAL:  Reveals firm brown stool which is heme-negative.  GENITOURINARY:  This examination was deferred.  EXTREMITIES:  No dependent edema, cyanosis, or clubbing.  LABORATORY DATA:  Pending at this time include a CBC, complete metabolic panel, and amylase.  These will be faxed to [id number] when obtained.  An electrocardiogram was done which shows a sinus rhythm of 97.  Occasional atrial premature complex, and early transition.  This was reviewed with Dr. Soyla Murphy. Pharr.  IMPRESSION: 1. Nausea and vomiting with dehydration.  PLAN:  The patient will be admitted for stabilization.  An abdominal CT as well as a chest x-ray and IV fluids will be given this afternoon.  Dr. Sabino Chang will e consulted.  All oral medicines will be held until the patient stops vomiting and the cause of his distress is discovered.  2. History of coronary artery disease, currently stable. 3. History of prostatic cancer. 4. Hyperlipidemia. DD:   06/28/99 TD:  06/28/99 Job: [id number]

## 2010-07-15 NOTE — Op Note (Signed)
. [facility]  Patient:    [name], [name] Chang                        MRN: [id number] Proc. Date: 06/29/99 Adm. Date:  [id number] Attending:  Londell Moh Dictator:   Abigail Miyamoto, M.D.                           Operative Report  PREOPERATIVE DIAGNOSIS:  Incarcerated inguinal hernia.  POSTOPERATIVE DIAGNOSIS:  Incarcerated inguinal hernia.  PROCEDURE:  Repair of right incarcerated inguinal hernia with mesh.  SURGEON:  Dr. Abigail Miyamoto.  ANESTHESIA:  General endotracheal anesthesia.  ESTIMATED BLOOD LOSS:  Minimal.  DESCRIPTION OF PROCEDURE:  The patient is brought to the operating room and identified as [name] Chang.  He is placed supine on the operating table where general anesthesia was induced.  His abdomen was then prepped and draped in the usual sterile fashion.  Using a #10 blade a longitudinal incision was made in the patients right groin overlying his previous inguinal incision.  The incision was carried down to the large incarcerated hernia with the electrocautery.  The hernia sac was identified and was found to be an indirect inguinal hernia.  The cord structures were intimately attached to this.  The sac was quite large and was found to contain a large amount of small intestines which appeared hemorrhagic and slightly dusky.  The inguinal ring was opened widely, and the small bowel was eviscerated.  Healthy appearing small bowel was identified proximal and distal to the incarcerated intestines. The incarcerated intestines pinked up moderately and showed good peristalsis and excellent pulse.  At this point the decision was made to return the bowel to the abdominal cavity without resection.  Once this was done the inguinal ring and fascial opening were closed with interrupted 2-0 Prolene sutures. During the dissection the vas deferen on the right side was transected in order to gain better exposure and reduce the hernia.   Once the defect was closed a piece of Prolene mesh was brought into the field and fashioned appropriately, and then placed over the inguinal floor and sewn in circumferentially with a running 2-0 Prolene suture.  The wound was then thoroughly irrigated.  The Scarpas fascia was then reapproximated with interrupted 3-0 Vicryl sutures, and the skin was closed with skin staples. The patient tolerated the procedure well.  All sponge, needle, and instrument counts were correct at the end of the procedure.  The patient was then extubated in the operating room and taken in stable condition to the recovery room. DD:  06/29/99 TD:  06/30/99 Job: [id number]

## 2010-07-15 NOTE — Discharge Summary (Signed)
[place]. [facility]  Patient:    [name] Chang, [name] Chang                        MRN: [id number] Adm. Date:  [id number] Disc. Date: [id number] Attending:  Abigail Miyamoto A                           Discharge Summary  DISCHARGE DIAGNOSIS: Incarcerated right inguinal hernia, status post emergent right inguinal hernia repair with mesh.  HISTORY OF PRESENT ILLNESS: [name] Chang is a very pleasant 77 year old gentleman, a patient of Dr. Soyla Murphy. Pharr, admitted by Dr. Renne Crigler on Jun 28, 1999 with abdominal pain, nausea, and multiple episodes of emesis.  HOSPITAL COURSE: He was admitted and made NPO and a CAT scan of his abdomen was ordered.  I was notified on the evening of Jun 28, 1999 that the CAT scan of the abdomen and pelvis showed an incarcerated hernia.  At that point I evaluated [name] Chang and he indeed had an incarcerated right inguinal hernia with a small bowel obstruction.  The hernia was tender on physical examination and I was unable to reduce it.  Therefore, the decision was made to proceed to the operating room for exploration.  The patient was taken emergently to the operating room and underwent repair of an incarcerated right inguinal hernia with mesh.  He tolerated the procedure well and was taken in stable condition with a nasogastric tube in place back to the regular surgical floor.  Postoperatively his recovery was uneventful.  By postoperative day #2 he was passing flatus and his NG tube was removed.  His incision appeared to be healing well.  He was slowly advanced to a regular diet.  His activity was slowly advanced.  Physical therapy was consulted and helped the patient from an ambulation standpoint.  By postoperative day #4 he was having bowel movements with the aid of enemas.  His incision still appeared to be healing quite well, and by postoperative day #6 the patient was tolerating a regular diet, he was ambulating well, his incision was healing  well, he was tolerating oral pain medications, and the decision was made to discharge the patient home.  DISCHARGE DIET: Regular.  DISCHARGE ACTIVITY: He is to do no heavy lifting for approximately four weeks.  DISCHARGE MEDICATIONS:  1. Resume home medication.  2. Darvocet for pain.  FOLLOW-UP: He will follow up with Dr. Magnus Ivan in one week post discharge. DD:  07/21/99 TD:  07/24/99 Job: [id number]

## 2010-07-15 NOTE — Discharge Summary (Signed)
[name] Chang, PORZIO NO.:  [id number]   MEDICAL RECORD NO.:  [id number]          PATIENT TYPE:  INP   LOCATION:  2036                         FACILITY:  [facility]   PHYSICIAN:  Alfonse Ras, MD   DATE OF BIRTH:  1916-01-22   DATE OF ADMISSION:  07/29/2006  DATE OF DISCHARGE:  08/06/2006                               DISCHARGE SUMMARY   ADMISSION DIAGNOSIS:  Cholecystitis.   DISCHARGE DIAGNOSIS:  Cholecystitis.   PROCEDURE:  Percutaneous cholecystostomy.   HOSPITAL COURSE:  I am just reviewing the chart since I saw the patient  on his last two days of his hospitalization.  Admission date was July 29, 2006 and discharge date was August 06, 2006.  Apparently, the patient  was admitted with the diagnoses of atrial fibrillation,  hypercholesterolemia, history of prostate cancer, status post radical  prostatectomy, and acute cholecystitis.  The patient was maintained on  IV antibiotics and underwent percutaneous cholecystostomy, tube was left  in place.  The patient's diet was slowly advanced, since he remained on  IV antibiotics.  The patient had a chest CT on August 04, 2006 consistent  with the PE.  He was anticoagulated and discharged home on Lovenox with  followup with Dr. Norma Fredrickson.   CONDITION ON DISCHARGE:  Improved.   FOLLOWUP:  Followup is with Dr. Norma Fredrickson and with [place] in the office in 2 to 3  weeks.      Alfonse Ras, MD  Electronically Signed     KRE/MEDQ  D:  12/05/2006  T:  12/05/2006  Job:  [id number]

## 2010-07-15 NOTE — Procedures (Signed)
[place]. [facility]  Patient:    ANEESH, FALLER                        MRN: [id number] Adm. Date:  [id number] Attending:  Sabino Gasser                           Procedure Report  PROCEDURE PERFORMED:  Colonoscopy.  INDICATIONS:  Colon cancer screening.  ANESTHESIA:  Demerol 50, Versed 5 mg.  DESCRIPTION OF PROCEDURE:  With the patient mildly sedated in the left lateral decubitus position, the Olympus videoscopic variable stiffness colonoscope was inserted into the rectum and passed under direct vision to the cecum, identified by the ileocecal valve and appendiceal orifice both of which are photographed.  From this point, the colonoscope was slowly withdrawn taking circumferential views of the entire colonic mucosa, stopping only in the rectum which appeared normal on direct view and showed internal hemorrhoids on retroflex view.  The prep was suboptimal in that there were many areas of liquid brown stool with particulate matter that could not be suctioned into the endoscope but no gross lesions seen.  The patients vital signs and pulse oximeter remained stable.  The patient tolerated the procedure well without apparent complications.  FINDINGS:  Grossly negative colonoscopic examination to the cecum.  PLAN:  Possibly repeat examination as needed or in five to ten years. DD:  09/11/00 TD:  09/11/00 Job: [id number]

## 2010-07-15 NOTE — Op Note (Signed)
[place]. [facility]  Patient:    [name] Chang, [name] Chang                        MRN: [id number] Proc. Date: 09/29/99 Adm. Date:  [id number] Disc. Date: [id number] Attending:  Abigail Miyamoto A                           Operative Report  PREOPERATIVE DIAGNOSES:  Left inguinal hernia, entrapped right testicle.  POSTOPERATIVE DIAGNOSES:  Left inguinal hernia, entrapped right testicle.  PROCEDURE: 1. Laparoscopic repair of left inguinal hernia with mesh. 2. Right groin exploration with right orchiectomy.  SURGEON:  Abigail Miyamoto, M.D.  ANESTHESIA:  General endotracheal anesthesia.  ESTIMATED BLOOD LOSS:  Minimal.  INDICATIONS:  [name] Chang is a pleasant 77 year old gentleman who underwent emergent repair of a right inguinal incarcerated hernia with mesh several months ago.  He presented back to the office with a known left inguinal hernia as well as what appeared to be an entrapped testicle, high riding on the right side which was nontender.  A decision was made to proceed with laparoscopic hernia repair with hopes of freeing the right testicle as well.  FINDINGS:  The patient was found to have a large indirect left inguinal hernia which was easily repaired.  He was found to have an entrapped right testicle necessitating a groin exploration.  The testicle was found to be quite hard and scarred down with a compromised blood supply; therefore, a decision was made to proceed with orchiectomy.  PROCEDURE IN DETAIL:  The patient was brought to the operating room and identified as [name] Chang.  He was placed supine on the operating table and general anesthesia was induced.  His abdomen, groins and scrotum were then prepped and draped in the usual sterile fashion.  Using a 15 blade a small transverse incision was made just below the umbilicus.  The incision was carried down through the fascia which was opened with a scalpel.  Next, the balloon dissector was  placed underneath the rectus sheath ______ down to the pelvis.  The balloon was then thoroughly insufflated dissecting down to the preperitoneal space.  Next the dissecting balloon was removed and a small balloon origin port was placed through the opening and insufflation of the preperitoneal space was begun.  Next, 2, 5 mm ports were placed in the midline under direct vision.  Dissection was then carried out on the right groin.  The patient was found to have a intact hernia repair from his previous repair.  The testicular cord, however, was quite scarred in place and I was unable to free up the right testicle.  Dissection was then carried out in the left groin.  The patient was found to have a very large indirect inguinal hernia which was reduced laparoscopically.  Next, a piece of 3 x 6 Prolene mesh was brought into the field and placed through the port.  The mesh was then placed in a onlay fashion in the left groin with an intact pubic tubercle as well as Coopers ligament and then up around the abdominal wall working laterally.  Adequate coverage of the hernia appeared to be achieved.  At this point all ports were removed and the air was released from the preperitoneal space.  A 0 Vicryl was then used to close the fascia at the umbilical port site.  Next, a small transverse incision was made in the  patients right groin through the previous incision.  The incision was carried down to the testicular cord and entrapped testicle.  The testicle was found to be quite hard and fibrotic with a large amount of scar tissue encasing it.  The scar tissue was slowly taken down with electrocautery.  The dissection failed to relieve any of the tension on the testicle in order to bring it back into the scrotum.  The previous hernia repair was felt to be quite intact.  The blood supply to the testicle was felt to be compromised and the testicle was felt to be ischemic. Therefore a decision was made to  proceed with orchiectomy.  The redundant cord was then transected after being tied off with a 2-0 Vicryl suture.  The testicle was then sent to pathology for identification.  The internal ring was then again reapproximated with several 2-0 Vicryl sutures. Scarpas fascia was then closed with interrupted 3-0 Vicryl sutures and the skin was closed with a running 4-0 Vicryl suture.  All other port sites were then also closed with 4-0 Vicryl and subcuticular sutures as well. Steri-Strips, gauze, and ______ applied was anesthetized with 0.25% Marcaine. The patient tolerated the procedure well.  All sponge, needle, and instrument counts were correct at the end of the procedure.  The patient was then extubated in the operating room and taken in stable condition to the recovery room. DD:  09/29/99 TD:  09/30/99 Job: [id number]

## 2010-07-15 NOTE — Op Note (Signed)
NAMEAVYUKTH, BONTEMPO NO.:  [id number]   MEDICAL RECORD NO.:  [id number]          PATIENT TYPE:  AMB   LOCATION:  DSC                          FACILITY:  [facility]   PHYSICIAN:  Abigail Miyamoto, M.D. DATE OF BIRTH:  06-Nov-1915   DATE OF PROCEDURE:  12/29/2003  DATE OF DISCHARGE:                                 OPERATIVE REPORT   PREOPERATIVE DIAGNOSIS:  Basal cell cancer, right lower extremity.   POSTOPERATIVE DIAGNOSIS:  Basal cell cancer, right lower extremity.   PROCEDURE:  Wide excision of basal cell carcinoma, right lower extremity.   SURGEON:  Abigail Miyamoto, M.D.   ANESTHESIA:  1% lidocaine and monitored anesthesia care.   ESTIMATED BLOOD LOSS:  Minimal.   PROCEDURE IN DETAIL:  The patient was brought to the operating room and  identified as [name] Chang.  He was placed supine on the operating table and  the anesthesia was induced.  His right lower extremity was then prepped and  draped in the usual sterile fashion.  The lesion was found to be on the  medial aspect of the leg below the knee approximately midway.  The skin  around it was then anesthetized in a wide area with 1% lidocaine after the  leg was prepped and draped.  A large elliptical incision measuring  approximately 9 cm in length was then created to completely excise the  lesion.  This was taken down to the fascia and tibia.  The entire ellipse of  skin containing the lesion was then sent to pathology for identification  after it was completely excised with the cautery.  Large skin flaps medial  and lateral had to be created with the electrocautery in order to pull the  skin together.  Once this was done, the subcutaneous layer was closed with  interrupted 2-0 Vicryl sutures and the skin was closed with horizontal  mattress and 3-0 nylon sutures.  Gauze and Kerlix were then applied.  The  patient tolerated the procedure well.  All counts were correct at the end of  the procedure.  The  patient was then taken in a stable condition from the  operating room to the recovery room.       DB/MEDQ  D:  12/29/2003  T:  12/29/2003  Job:  [id number]

## 2010-09-20 ENCOUNTER — Encounter: Payer: Self-pay | Admitting: Podiatry

## 2010-12-12 LAB — CREATININE, SERUM
Creatinine, Ser: 0.76
GFR calc Af Amer: >60

## 2010-12-12 LAB — CBC
HCT: 37.4 — ABNORMAL LOW
Hemoglobin: 11.5 — ABNORMAL LOW
Hemoglobin: 12.5 — ABNORMAL LOW
MCHC: 33.8
MCV: 90.7
Platelets: 126 — ABNORMAL LOW
RBC: 3.76 — ABNORMAL LOW
RBC: 4.09 — ABNORMAL LOW
RDW: 14.5 — ABNORMAL HIGH
WBC: 5.3

## 2010-12-12 LAB — HEPATIC FUNCTION PANEL
AST: 79 — ABNORMAL HIGH
Albumin: 2.9 — ABNORMAL LOW
Total Bilirubin: 1
Total Protein: 6.1

## 2010-12-12 LAB — POTASSIUM: Potassium: 4.5

## 2010-12-12 LAB — BASIC METABOLIC PANEL
BUN: 14
Calcium: 8.9
Creatinine, Ser: 0.72
GFR calc non Af Amer: >60
Glucose, Bld: 95

## 2010-12-12 LAB — PROTIME-INR
INR: 1.7 — ABNORMAL HIGH
Prothrombin Time: 20.7 — ABNORMAL HIGH

## 2010-12-12 LAB — SAVE SMEAR

## 2010-12-12 LAB — APTT: aPTT: 55 — ABNORMAL HIGH

## 2010-12-15 LAB — URINE MICROSCOPIC-ADD ON

## 2010-12-15 LAB — PREPARE FRESH FROZEN PLASMA

## 2010-12-15 LAB — PROTIME-INR
INR: 1.2
INR: 1.3
INR: 1.3
INR: 1.3
INR: 2 — ABNORMAL HIGH
INR: 2 — ABNORMAL HIGH
Prothrombin Time: 15.5 — ABNORMAL HIGH
Prothrombin Time: 16.2 — ABNORMAL HIGH
Prothrombin Time: 16.2 — ABNORMAL HIGH
Prothrombin Time: 23.3 — ABNORMAL HIGH
Prothrombin Time: 23.7 — ABNORMAL HIGH

## 2010-12-15 LAB — BASIC METABOLIC PANEL
BUN: 11
BUN: 7
BUN: 7
BUN: 8
CO2: 23
CO2: 27
CO2: 27
Calcium: 7.5 — ABNORMAL LOW
Calcium: 7.8 — ABNORMAL LOW
Chloride: 102
Chloride: 103
Chloride: 105
Creatinine, Ser: 0.61
Creatinine, Ser: 0.62
Creatinine, Ser: 0.68
GFR calc Af Amer: >60
GFR calc non Af Amer: >60
GFR calc non Af Amer: >60
Glucose, Bld: 113 — ABNORMAL HIGH
Glucose, Bld: 116 — ABNORMAL HIGH
Glucose, Bld: 130 — ABNORMAL HIGH
Glucose, Bld: 152 — ABNORMAL HIGH
Potassium: 3.6
Sodium: 136

## 2010-12-15 LAB — BODY FLUID CULTURE: Gram Stain: NONE SEEN

## 2010-12-15 LAB — COMPREHENSIVE METABOLIC PANEL
ALT: 13
ALT: 16
ALT: 34
AST: 22
AST: 22
Albumin: 2.1 — ABNORMAL LOW
Alkaline Phosphatase: 58
Alkaline Phosphatase: 68
CO2: 24
CO2: 24
Calcium: 8.3 — ABNORMAL LOW
Calcium: 8.5
Chloride: 102
Chloride: 105
Creatinine, Ser: 0.7
GFR calc Af Amer: >60
GFR calc Af Amer: >60
GFR calc non Af Amer: >60
GFR calc non Af Amer: >60
Glucose, Bld: 116 — ABNORMAL HIGH
Glucose, Bld: 164 — ABNORMAL HIGH
Potassium: 3.1 — ABNORMAL LOW
Potassium: 3.6
Sodium: 133 — ABNORMAL LOW
Sodium: 135
Total Bilirubin: 1.6 — ABNORMAL HIGH
Total Protein: 5.7 — ABNORMAL LOW

## 2010-12-15 LAB — CBC
HCT: 35.3 — ABNORMAL LOW
HCT: 39.3
HCT: 43.4
Hemoglobin: 11.7 — ABNORMAL LOW
Hemoglobin: 12.2 — ABNORMAL LOW
Hemoglobin: 13.8
Hemoglobin: 14.7
MCHC: 33.4
MCHC: 33.6
MCHC: 33.8
MCHC: 34.1
MCV: 89.6
MCV: 90.5
MCV: 91
MCV: 92.2
Platelets: 105 — ABNORMAL LOW
Platelets: 122 — ABNORMAL LOW
Platelets: 137 — ABNORMAL LOW
Platelets: 170
Platelets: 177
Platelets: 96 — ABNORMAL LOW
RBC: 3.86 — ABNORMAL LOW
RBC: 4.26
RBC: 4.45
RBC: 4.73
RDW: 13.9
RDW: 14
RDW: 14.2 — ABNORMAL HIGH
RDW: 14.2 — ABNORMAL HIGH
RDW: 14.4 — ABNORMAL HIGH
WBC: 12.8 — ABNORMAL HIGH
WBC: 18.8 — ABNORMAL HIGH

## 2010-12-15 LAB — APTT: aPTT: 42 — ABNORMAL HIGH

## 2010-12-15 LAB — BLOOD GAS, ARTERIAL
Acid-base deficit: 1.5
Drawn by: 24549
TCO2: 23.4
pCO2 arterial: 35.5
pH, Arterial: 7.415
pO2, Arterial: 66.5 — ABNORMAL LOW

## 2010-12-15 LAB — I-STAT 8, (EC8 V) (CONVERTED LAB)
BUN: 12
Chloride: 101
HCT: 47
Potassium: 3.7
pCO2, Ven: 36.2 — ABNORMAL LOW
pH, Ven: 7.438 — ABNORMAL HIGH

## 2010-12-15 LAB — HEPATIC FUNCTION PANEL
Albumin: 3.3 — ABNORMAL LOW
Alkaline Phosphatase: 59
Total Protein: 7.2

## 2010-12-15 LAB — D-DIMER, QUANTITATIVE: D-Dimer, Quant: 5.25 — ABNORMAL HIGH

## 2010-12-15 LAB — URINALYSIS, ROUTINE W REFLEX MICROSCOPIC
Leukocytes, UA: NEGATIVE
Nitrite: NEGATIVE
Specific Gravity, Urine: 1.029
Urobilinogen, UA: 2 — ABNORMAL HIGH
pH: 6

## 2010-12-15 LAB — DIFFERENTIAL
Basophils Absolute: 0
Eosinophils Absolute: 0.2
Eosinophils Relative: 2
Lymphocytes Relative: 4 — ABNORMAL LOW
Lymphs Abs: 0.8
Neutro Abs: 15.3 — ABNORMAL HIGH
Neutrophils Relative %: 75
Neutrophils Relative %: 87 — ABNORMAL HIGH

## 2010-12-15 LAB — POCT CARDIAC MARKERS
CKMB, poc: <1 — ABNORMAL LOW
Myoglobin, poc: 114

## 2010-12-15 LAB — LIPASE, BLOOD: Lipase: 21

## 2010-12-15 LAB — MAGNESIUM
Magnesium: 2
Magnesium: 2.1

## 2010-12-15 LAB — CROSSMATCH: Antibody Screen: NEGATIVE

## 2010-12-15 LAB — HEMOGLOBIN A1C: Hgb A1c MFr Bld: 6.2 — ABNORMAL HIGH

## 2013-05-28 ENCOUNTER — Other Ambulatory Visit: Payer: Self-pay | Admitting: Internal Medicine

## 2013-05-28 DIAGNOSIS — R609 Edema, unspecified: Secondary | ICD-10-CM

## 2013-05-29 ENCOUNTER — Ambulatory Visit
Admission: RE | Admit: 2013-05-29 | Discharge: 2013-05-29 | Disposition: A | Discharge status: Home / Self Care | Payer: Medicare Other | Source: Ambulatory Visit | Attending: Internal Medicine | Admitting: Internal Medicine

## 2013-05-29 DIAGNOSIS — R609 Edema, unspecified: Secondary | ICD-10-CM

## 2013-10-14 ENCOUNTER — Other Ambulatory Visit: Payer: Self-pay | Admitting: Dermatology

## 2014-03-09 DIAGNOSIS — I4891 Unspecified atrial fibrillation: Secondary | ICD-10-CM | POA: Diagnosis not present

## 2014-03-09 DIAGNOSIS — Z7901 Long term (current) use of anticoagulants: Secondary | ICD-10-CM | POA: Diagnosis not present

## 2014-03-17 ENCOUNTER — Encounter (HOSPITAL_COMMUNITY): Payer: Self-pay | Admitting: Emergency Medicine

## 2014-03-17 ENCOUNTER — Inpatient Hospital Stay (HOSPITAL_COMMUNITY)
Admission: EM | Admit: 2014-03-17 | Discharge: 2014-03-23 | DRG: 470 | Disposition: A | Discharge status: Skilled Nursing Facility | Payer: Medicare Other | Attending: Internal Medicine | Admitting: Internal Medicine

## 2014-03-17 ENCOUNTER — Emergency Department (HOSPITAL_COMMUNITY): Payer: Medicare Other

## 2014-03-17 ENCOUNTER — Inpatient Hospital Stay (HOSPITAL_COMMUNITY): Payer: Medicare Other

## 2014-03-17 DIAGNOSIS — S52502A Unspecified fracture of the lower end of left radius, initial encounter for closed fracture: Secondary | ICD-10-CM | POA: Diagnosis not present

## 2014-03-17 DIAGNOSIS — M1990 Unspecified osteoarthritis, unspecified site: Secondary | ICD-10-CM | POA: Diagnosis not present

## 2014-03-17 DIAGNOSIS — S72009A Fracture of unspecified part of neck of unspecified femur, initial encounter for closed fracture: Secondary | ICD-10-CM

## 2014-03-17 DIAGNOSIS — S52532A Colles' fracture of left radius, initial encounter for closed fracture: Secondary | ICD-10-CM | POA: Diagnosis not present

## 2014-03-17 DIAGNOSIS — S62642A Nondisplaced fracture of proximal phalanx of right middle finger, initial encounter for closed fracture: Secondary | ICD-10-CM | POA: Diagnosis not present

## 2014-03-17 DIAGNOSIS — S72045A Nondisplaced fracture of base of neck of left femur, initial encounter for closed fracture: Secondary | ICD-10-CM | POA: Diagnosis not present

## 2014-03-17 DIAGNOSIS — I482 Chronic atrial fibrillation: Secondary | ICD-10-CM | POA: Diagnosis not present

## 2014-03-17 DIAGNOSIS — E785 Hyperlipidemia, unspecified: Secondary | ICD-10-CM

## 2014-03-17 DIAGNOSIS — I10 Essential (primary) hypertension: Secondary | ICD-10-CM | POA: Diagnosis present

## 2014-03-17 DIAGNOSIS — S52572A Other intraarticular fracture of lower end of left radius, initial encounter for closed fracture: Secondary | ICD-10-CM | POA: Diagnosis not present

## 2014-03-17 DIAGNOSIS — S52612A Displaced fracture of left ulna styloid process, initial encounter for closed fracture: Secondary | ICD-10-CM | POA: Diagnosis not present

## 2014-03-17 DIAGNOSIS — I4891 Unspecified atrial fibrillation: Secondary | ICD-10-CM | POA: Diagnosis present

## 2014-03-17 DIAGNOSIS — S99912A Unspecified injury of left ankle, initial encounter: Secondary | ICD-10-CM | POA: Diagnosis not present

## 2014-03-17 DIAGNOSIS — Z7901 Long term (current) use of anticoagulants: Secondary | ICD-10-CM

## 2014-03-17 DIAGNOSIS — S52615A Nondisplaced fracture of left ulna styloid process, initial encounter for closed fracture: Secondary | ICD-10-CM | POA: Diagnosis not present

## 2014-03-17 DIAGNOSIS — S5292XA Unspecified fracture of left forearm, initial encounter for closed fracture: Secondary | ICD-10-CM | POA: Diagnosis not present

## 2014-03-17 DIAGNOSIS — S4992XA Unspecified injury of left shoulder and upper arm, initial encounter: Secondary | ICD-10-CM | POA: Diagnosis not present

## 2014-03-17 DIAGNOSIS — D696 Thrombocytopenia, unspecified: Secondary | ICD-10-CM | POA: Diagnosis present

## 2014-03-17 DIAGNOSIS — S0093XA Contusion of unspecified part of head, initial encounter: Secondary | ICD-10-CM | POA: Diagnosis not present

## 2014-03-17 DIAGNOSIS — M25551 Pain in right hip: Secondary | ICD-10-CM | POA: Diagnosis not present

## 2014-03-17 DIAGNOSIS — S8992XA Unspecified injury of left lower leg, initial encounter: Secondary | ICD-10-CM | POA: Diagnosis not present

## 2014-03-17 DIAGNOSIS — K219 Gastro-esophageal reflux disease without esophagitis: Secondary | ICD-10-CM | POA: Diagnosis not present

## 2014-03-17 DIAGNOSIS — R6883 Chills (without fever): Secondary | ICD-10-CM | POA: Diagnosis not present

## 2014-03-17 DIAGNOSIS — W19XXXA Unspecified fall, initial encounter: Secondary | ICD-10-CM | POA: Diagnosis not present

## 2014-03-17 DIAGNOSIS — M6281 Muscle weakness (generalized): Secondary | ICD-10-CM | POA: Diagnosis not present

## 2014-03-17 DIAGNOSIS — S72012A Unspecified intracapsular fracture of left femur, initial encounter for closed fracture: Secondary | ICD-10-CM | POA: Diagnosis not present

## 2014-03-17 DIAGNOSIS — W010XXA Fall on same level from slipping, tripping and stumbling without subsequent striking against object, initial encounter: Secondary | ICD-10-CM | POA: Diagnosis present

## 2014-03-17 DIAGNOSIS — R1314 Dysphagia, pharyngoesophageal phase: Secondary | ICD-10-CM | POA: Diagnosis not present

## 2014-03-17 DIAGNOSIS — S6392XA Sprain of unspecified part of left wrist and hand, initial encounter: Secondary | ICD-10-CM | POA: Diagnosis not present

## 2014-03-17 DIAGNOSIS — S79911A Unspecified injury of right hip, initial encounter: Secondary | ICD-10-CM | POA: Diagnosis not present

## 2014-03-17 DIAGNOSIS — R0602 Shortness of breath: Secondary | ICD-10-CM | POA: Diagnosis not present

## 2014-03-17 DIAGNOSIS — S0003XA Contusion of scalp, initial encounter: Secondary | ICD-10-CM | POA: Diagnosis present

## 2014-03-17 DIAGNOSIS — Z9181 History of falling: Secondary | ICD-10-CM | POA: Diagnosis not present

## 2014-03-17 DIAGNOSIS — R531 Weakness: Secondary | ICD-10-CM | POA: Diagnosis not present

## 2014-03-17 DIAGNOSIS — M25572 Pain in left ankle and joints of left foot: Secondary | ICD-10-CM | POA: Diagnosis not present

## 2014-03-17 DIAGNOSIS — M79622 Pain in left upper arm: Secondary | ICD-10-CM | POA: Diagnosis not present

## 2014-03-17 DIAGNOSIS — R079 Chest pain, unspecified: Secondary | ICD-10-CM | POA: Diagnosis not present

## 2014-03-17 DIAGNOSIS — M25562 Pain in left knee: Secondary | ICD-10-CM | POA: Diagnosis not present

## 2014-03-17 DIAGNOSIS — M21252 Flexion deformity, left hip: Secondary | ICD-10-CM | POA: Diagnosis not present

## 2014-03-17 DIAGNOSIS — S6991XA Unspecified injury of right wrist, hand and finger(s), initial encounter: Secondary | ICD-10-CM | POA: Diagnosis not present

## 2014-03-17 DIAGNOSIS — Z96642 Presence of left artificial hip joint: Secondary | ICD-10-CM

## 2014-03-17 DIAGNOSIS — R278 Other lack of coordination: Secondary | ICD-10-CM | POA: Diagnosis not present

## 2014-03-17 DIAGNOSIS — S299XXA Unspecified injury of thorax, initial encounter: Secondary | ICD-10-CM | POA: Diagnosis not present

## 2014-03-17 DIAGNOSIS — I2510 Atherosclerotic heart disease of native coronary artery without angina pectoris: Secondary | ICD-10-CM | POA: Diagnosis not present

## 2014-03-17 DIAGNOSIS — R404 Transient alteration of awareness: Secondary | ICD-10-CM | POA: Diagnosis not present

## 2014-03-17 DIAGNOSIS — Z01810 Encounter for preprocedural cardiovascular examination: Secondary | ICD-10-CM

## 2014-03-17 DIAGNOSIS — S72002B Fracture of unspecified part of neck of left femur, initial encounter for open fracture type I or II: Secondary | ICD-10-CM | POA: Diagnosis not present

## 2014-03-17 DIAGNOSIS — S199XXA Unspecified injury of neck, initial encounter: Secondary | ICD-10-CM | POA: Diagnosis not present

## 2014-03-17 DIAGNOSIS — S72002A Fracture of unspecified part of neck of left femur, initial encounter for closed fracture: Secondary | ICD-10-CM

## 2014-03-17 DIAGNOSIS — Z471 Aftercare following joint replacement surgery: Secondary | ICD-10-CM | POA: Diagnosis not present

## 2014-03-17 DIAGNOSIS — M79641 Pain in right hand: Secondary | ICD-10-CM | POA: Diagnosis not present

## 2014-03-17 DIAGNOSIS — I517 Cardiomegaly: Secondary | ICD-10-CM | POA: Diagnosis not present

## 2014-03-17 DIAGNOSIS — T1490XA Injury, unspecified, initial encounter: Secondary | ICD-10-CM

## 2014-03-17 DIAGNOSIS — S72021A Displaced fracture of epiphysis (separation) (upper) of right femur, initial encounter for closed fracture: Secondary | ICD-10-CM | POA: Diagnosis not present

## 2014-03-17 LAB — BASIC METABOLIC PANEL
ANION GAP: 7 (ref 5–15)
BUN: 17 mg/dL (ref 6–23)
CALCIUM: 9 mg/dL (ref 8.4–10.5)
CHLORIDE: 106 meq/L (ref 96–112)
CO2: 24 mmol/L (ref 19–32)
Creatinine, Ser: 0.81 mg/dL (ref 0.50–1.35)
GFR calc Af Amer: 84 mL/min — ABNORMAL LOW (ref >90)
GFR, EST NON AFRICAN AMERICAN: 73 mL/min — AB (ref >90)
Glucose, Bld: 168 mg/dL — ABNORMAL HIGH (ref 70–99)
Potassium: 4.4 mmol/L (ref 3.5–5.1)
SODIUM: 137 mmol/L (ref 135–145)

## 2014-03-17 LAB — CBC WITH DIFFERENTIAL/PLATELET
BASOS ABS: 0 10*3/uL (ref 0.0–0.1)
Basophils Relative: 0 % (ref 0–1)
Eosinophils Absolute: 0 10*3/uL (ref 0.0–0.7)
Eosinophils Relative: 0 % (ref 0–5)
HCT: 39.4 % (ref 39.0–52.0)
HEMOGLOBIN: 13.1 g/dL (ref 13.0–17.0)
LYMPHS ABS: 1.1 10*3/uL (ref 0.7–4.0)
Lymphocytes Relative: 13 % (ref 12–46)
MCH: 31.7 pg (ref 26.0–34.0)
MCHC: 33.2 g/dL (ref 30.0–36.0)
MCV: 95.4 fL (ref 78.0–100.0)
MONO ABS: 0.6 10*3/uL (ref 0.1–1.0)
MONOS PCT: 7 % (ref 3–12)
Neutro Abs: 6.4 10*3/uL (ref 1.7–7.7)
Neutrophils Relative %: 80 % — ABNORMAL HIGH (ref 43–77)
Platelets: 102 10*3/uL — ABNORMAL LOW (ref 150–400)
RBC: 4.13 MIL/uL — AB (ref 4.22–5.81)
RDW: 14.2 % (ref 11.5–15.5)
WBC: 8.1 10*3/uL (ref 4.0–10.5)

## 2014-03-17 LAB — PROTIME-INR
INR: 2.33 — AB (ref 0.00–1.49)
INR: 2.45 — ABNORMAL HIGH (ref 0.00–1.49)
PROTHROMBIN TIME: 26.8 s — AB (ref 11.6–15.2)
Prothrombin Time: 25.8 seconds — ABNORMAL HIGH (ref 11.6–15.2)

## 2014-03-17 LAB — APTT: aPTT: 48 seconds — ABNORMAL HIGH (ref 24–37)

## 2014-03-17 MED ORDER — WARFARIN - PHARMACIST DOSING INPATIENT: Freq: Every day | Status: DC

## 2014-03-17 MED ORDER — MORPHINE SULFATE 4 MG/ML IJ SOLN
6.0000 mg | Freq: Once | INTRAMUSCULAR | Status: AC
  Administered 2014-03-17: 6 mg via INTRAVENOUS
  Filled 2014-03-17: qty 2

## 2014-03-17 MED ORDER — MORPHINE SULFATE 2 MG/ML IJ SOLN: 0.5000 mg | INTRAMUSCULAR | Status: DC | PRN

## 2014-03-17 MED ORDER — BISACODYL 10 MG RE SUPP: 10.0000 mg | Freq: Every day | RECTAL | Status: DC | PRN

## 2014-03-17 MED ORDER — MORPHINE SULFATE 4 MG/ML IJ SOLN: 4.0000 mg | Freq: Once | INTRAMUSCULAR | Status: DC

## 2014-03-17 MED ORDER — PANTOPRAZOLE SODIUM 40 MG PO TBEC
40.0000 mg | DELAYED_RELEASE_TABLET | Freq: Every day | ORAL | Status: DC
  Administered 2014-03-18 – 2014-03-23 (×5): 40 mg via ORAL
  Filled 2014-03-17 (×5): qty 1

## 2014-03-17 MED ORDER — METHOCARBAMOL 500 MG PO TABS
500.0000 mg | ORAL_TABLET | Freq: Four times a day (QID) | ORAL | Status: DC | PRN
  Administered 2014-03-21 – 2014-03-22 (×2): 500 mg via ORAL
  Filled 2014-03-17 (×2): qty 1

## 2014-03-17 MED ORDER — DOCUSATE SODIUM 100 MG PO CAPS
100.0000 mg | ORAL_CAPSULE | Freq: Two times a day (BID) | ORAL | Status: DC
  Administered 2014-03-18 – 2014-03-22 (×8): 100 mg via ORAL
  Filled 2014-03-17 (×12): qty 1

## 2014-03-17 MED ORDER — MORPHINE SULFATE 4 MG/ML IJ SOLN
6.0000 mg | Freq: Once | INTRAMUSCULAR | Status: AC
  Administered 2014-03-17: 6 mg via INTRAVENOUS

## 2014-03-17 MED ORDER — MORPHINE SULFATE 4 MG/ML IJ SOLN
6.0000 mg | Freq: Once | INTRAMUSCULAR | Status: DC
  Filled 2014-03-17: qty 2

## 2014-03-17 MED ORDER — WARFARIN SODIUM 7.5 MG PO TABS
7.5000 mg | ORAL_TABLET | ORAL | Status: DC
  Filled 2014-03-17: qty 1

## 2014-03-17 MED ORDER — HYDROCODONE-ACETAMINOPHEN 5-325 MG PO TABS
1.0000 | ORAL_TABLET | Freq: Four times a day (QID) | ORAL | Status: DC | PRN
  Administered 2014-03-17: 2 via ORAL
  Administered 2014-03-18: 1 via ORAL
  Administered 2014-03-18: 2 via ORAL
  Administered 2014-03-19: 1 via ORAL
  Administered 2014-03-19 (×2): 2 via ORAL
  Filled 2014-03-17 (×5): qty 2
  Filled 2014-03-17: qty 1
  Filled 2014-03-17: qty 2
  Filled 2014-03-17: qty 1

## 2014-03-17 MED ORDER — ROSUVASTATIN CALCIUM 10 MG PO TABS
10.0000 mg | ORAL_TABLET | Freq: Every day | ORAL | Status: DC
  Administered 2014-03-18 – 2014-03-21 (×4): 10 mg via ORAL
  Filled 2014-03-17 (×6): qty 1

## 2014-03-17 MED ORDER — PSYLLIUM 95 % PO PACK
1.0000 | PACK | Freq: Every day | ORAL | Status: DC
  Administered 2014-03-18 – 2014-03-21 (×3): 1 via ORAL
  Filled 2014-03-17 (×6): qty 1

## 2014-03-17 MED ORDER — METHOCARBAMOL 1000 MG/10ML IJ SOLN
500.0000 mg | Freq: Four times a day (QID) | INTRAVENOUS | Status: DC | PRN
  Filled 2014-03-17: qty 5

## 2014-03-17 MED ORDER — DILTIAZEM HCL ER COATED BEADS 180 MG PO CP24
180.0000 mg | ORAL_CAPSULE | Freq: Every day | ORAL | Status: DC
  Administered 2014-03-18 – 2014-03-19 (×2): 180 mg via ORAL
  Filled 2014-03-17 (×3): qty 1

## 2014-03-17 MED ORDER — METOPROLOL TARTRATE 25 MG PO TABS
25.0000 mg | ORAL_TABLET | Freq: Two times a day (BID) | ORAL | Status: DC
  Administered 2014-03-17 – 2014-03-23 (×11): 25 mg via ORAL
  Filled 2014-03-17 (×14): qty 1

## 2014-03-17 MED ORDER — WARFARIN SODIUM 5 MG PO TABS: 5.0000 mg | ORAL_TABLET | ORAL | Status: DC

## 2014-03-17 NOTE — Consult Note (Signed)
Reason for Consult: Left distal radius fracture Referring Physician: Dr. Janyth Pupa is an 81 y.o. male.  HPI: The patient is an extremely pleasant 81 year old gentleman who fell earlier today, he tripped in his driveway. He denies syncopal episode, and states he simply lost his balance. Patient does have a history of bilateral knee arthritis and states that his balance is at times unsteady. The patient sustained a left distal radius fracture as well as a left orbital contusion. He was seen and evaluated initially by the emergency room staff. He was noted to have a left distal radius fracture intra-articular in nature. In addition there is question of an impacted left femoral neck fracture, of which, general orthopedics was consulted. Patient's main complaint is wrist pain about the left upper extremity at this juncture. He does not complain of any significant degree of pain about the left hip or the right hip. He denies pain about the right upper extremity. All radiographs and scans are reviewed. Asked medical history is noted. He is on chronic anticoagulation.  Past Medical History  Diagnosis Date  . Hypertension   . High cholesterol   . Heart trouble   . Osteoarthritis   . Bone cancer   . Bilateral swelling of feet     Past Surgical History  Procedure Laterality Date  . Cholecystectomy      No family history on file.  Social History:  reports that he has never smoked. He has never used smokeless tobacco. He reports that he drinks alcohol. His drug history is not on file.  Allergies: No Known Allergies  Medications: I have reviewed the patient's current medications.  Results for orders placed or performed during the hospital encounter of 03/17/14 (from the past 48 hour(s))  CBC with Differential     Status: Abnormal   Collection Time: 03/17/14  3:25 PM  Result Value Ref Range   WBC 8.1 4.0 - 10.5 K/uL    Comment: WHITE COUNT CONFIRMED ON SMEAR   RBC 4.13 (L) 4.22 - 5.81  MIL/uL   Hemoglobin 13.1 13.0 - 17.0 g/dL   HCT 39.4 39.0 - 52.0 %   MCV 95.4 78.0 - 100.0 fL   MCH 31.7 26.0 - 34.0 pg   MCHC 33.2 30.0 - 36.0 g/dL   RDW 14.2 11.5 - 15.5 %   Platelets 102 (L) 150 - 400 K/uL    Comment: PLATELET COUNT CONFIRMED BY SMEAR   Neutrophils Relative % 80 (H) 43 - 77 %   Lymphocytes Relative 13 12 - 46 %   Monocytes Relative 7 3 - 12 %   Eosinophils Relative 0 0 - 5 %   Basophils Relative 0 0 - 1 %   Neutro Abs 6.4 1.7 - 7.7 K/uL   Lymphs Abs 1.1 0.7 - 4.0 K/uL   Monocytes Absolute 0.6 0.1 - 1.0 K/uL   Eosinophils Absolute 0.0 0.0 - 0.7 K/uL   Basophils Absolute 0.0 0.0 - 0.1 K/uL   RBC Morphology POLYCHROMASIA PRESENT   Basic metabolic panel     Status: Abnormal   Collection Time: 03/17/14  3:25 PM  Result Value Ref Range   Sodium 137 135 - 145 mmol/L    Comment: Please note change in reference range.   Potassium 4.4 3.5 - 5.1 mmol/L    Comment: Please note change in reference range.   Chloride 106 96 - 112 mEq/L   CO2 24 19 - 32 mmol/L   Glucose, Bld 168 (H) 70 -  99 mg/dL   BUN 17 6 - 23 mg/dL   Creatinine, Ser 0.81 0.50 - 1.35 mg/dL   Calcium 9.0 8.4 - 10.5 mg/dL   GFR calc non Af Amer 73 (L) >90 mL/min   GFR calc Af Amer 84 (L) >90 mL/min    Comment: (NOTE) The eGFR has been calculated using the CKD EPI equation. This calculation has not been validated in all clinical situations. eGFR's persistently <90 mL/min signify possible Chronic Kidney Disease.    Anion gap 7 5 - 15  Protime-INR     Status: Abnormal   Collection Time: 03/17/14  3:25 PM  Result Value Ref Range   Prothrombin Time 25.8 (H) 11.6 - 15.2 seconds   INR 2.33 (H) 0.00 - 1.49    Dg Chest 2 View  03/17/2014   CLINICAL DATA:  [place] today with chest pain  EXAM: CHEST  2 VIEW  COMPARISON:  08/23/2009  FINDINGS: Cardiac shadow remains enlarged. Postsurgical changes are again noted. Mild interstitial changes are seen without focal infiltrate or sizable effusion. No acute bony  abnormality is noted.  IMPRESSION: No acute abnormality seen.   Electronically Signed   By: Inez Catalina M.D.   On: 03/17/2014 17:11   Dg Forearm Left  03/17/2014   CLINICAL DATA:  Wrist and distal forearm pain after falling.  EXAM: LEFT FOREARM - 2 VIEW  COMPARISON:  None.  FINDINGS: The bones are demineralized. There is a mildly impacted intra-articular fracture of the distal radius. There is a nondisplaced ulnar styloid fracture. No proximal injuries are identified. The alignment appears normal at the elbow. There is scapholunate diastasis. Scattered vascular calcifications are noted.  IMPRESSION: Impacted intra-articular fracture of the distal radius with nondisplaced ulnar styloid fracture.   Electronically Signed   By: Camie Patience M.D.   On: 03/17/2014 17:12   Dg Wrist Complete Left  03/17/2014   CLINICAL DATA:  Status post fall today with severe left wrist pain. Initial encounter.  EXAM: LEFT WRIST - COMPLETE 3+ VIEW  COMPARISON:  None.  FINDINGS: The patient has an acute and mildly impacted distal radius fracture with intra-articular extension. No other fracture is identified. There is marked soft tissue swelling about the wrist. First CMC and scaphoid trapezium trapezoid joint osteoarthritis is noted.  IMPRESSION: Mildly impacted distal radius fracture with intra-articular involvement and associated soft tissue swelling.   Electronically Signed   By: Inge Rise M.D.   On: 03/17/2014 17:12   Dg Ankle Complete Left  03/17/2014   CLINICAL DATA:  Left ankle pain following fall, initial encounter  EXAM: LEFT ANKLE COMPLETE - 3+ VIEW  COMPARISON:  None.  FINDINGS: Mild soft tissue swelling is noted laterally. No acute fracture or dislocation is seen. Diffuse vascular calcifications are noted.  IMPRESSION: Soft tissue swelling without acute bony abnormality.   Electronically Signed   By: Inez Catalina M.D.   On: 03/17/2014 17:18   Ct Head Wo Contrast  03/17/2014   CLINICAL DATA:  Status post fall  with a blow to the head.  EXAM: CT HEAD WITHOUT CONTRAST  CT CERVICAL SPINE WITHOUT CONTRAST  TECHNIQUE: Multidetector CT imaging of the head and cervical spine was performed following the standard protocol without intravenous contrast. Multiplanar CT image reconstructions of the cervical spine were also generated.  COMPARISON:  Chest CT scan 08/03/2006.  FINDINGS: CT HEAD FINDINGS  Contusion is seen over the left frontal bone without underlying fracture. The brain is atrophic with chronic microvascular ischemic change. No  evidence of acute intracranial abnormality including infarct, hemorrhage, mass lesion, mass effect, midline shift or abnormal extra-axial fluid collection. No hydrocephalus or pneumocephalus. Imaged paranasal sinuses and mastoid air cells are clear.  CT CERVICAL SPINE FINDINGS  There is no fracture or malalignment of the cervical spine. Mild cervical spondylosis predominantly involves the facet joints. Lung apices demonstrate some scarring on the left, unchanged, and scattered ground-glass attenuation.  IMPRESSION: Hematoma over the left frontal bone without underlying fracture or acute intracranial abnormality.  No acute abnormality cervical spine.   Electronically Signed   By: Inge Rise M.D.   On: 03/17/2014 16:10   Ct Cervical Spine Wo Contrast  03/17/2014   CLINICAL DATA:  Status post fall with a blow to the head.  EXAM: CT HEAD WITHOUT CONTRAST  CT CERVICAL SPINE WITHOUT CONTRAST  TECHNIQUE: Multidetector CT imaging of the head and cervical spine was performed following the standard protocol without intravenous contrast. Multiplanar CT image reconstructions of the cervical spine were also generated.  COMPARISON:  Chest CT scan 08/03/2006.  FINDINGS: CT HEAD FINDINGS  Contusion is seen over the left frontal bone without underlying fracture. The brain is atrophic with chronic microvascular ischemic change. No evidence of acute intracranial abnormality including infarct, hemorrhage,  mass lesion, mass effect, midline shift or abnormal extra-axial fluid collection. No hydrocephalus or pneumocephalus. Imaged paranasal sinuses and mastoid air cells are clear.  CT CERVICAL SPINE FINDINGS  There is no fracture or malalignment of the cervical spine. Mild cervical spondylosis predominantly involves the facet joints. Lung apices demonstrate some scarring on the left, unchanged, and scattered ground-glass attenuation.  IMPRESSION: Hematoma over the left frontal bone without underlying fracture or acute intracranial abnormality.  No acute abnormality cervical spine.   Electronically Signed   By: Inge Rise M.D.   On: 03/17/2014 16:10   Dg Knee Complete 4 Views Left  03/17/2014   CLINICAL DATA:  Fall, severe pain  EXAM: LEFT KNEE - COMPLETE 4+ VIEW  COMPARISON:  None.  FINDINGS: Four views of the left knee submitted. No acute fracture or subluxation. There is diffuse osteopenia. Significant narrowing of medial joint compartment. Mild chondrocalcinosis. Mild spurring of medial femoral condyle and medial tibial plateau. Narrowing of patellofemoral joint space. No joint effusion. Atherosclerotic calcifications of femoral and popliteal artery.  IMPRESSION: No acute fracture or subluxation. Diffuse osteopenia. Osteoarthritic changes as described above. Mild chondrocalcinosis.   Electronically Signed   By: Lahoma Crocker M.D.   On: 03/17/2014 17:13   Dg Humerus Left  03/17/2014   CLINICAL DATA:  Recent fall with left arm pain  EXAM: LEFT HUMERUS - 2+ VIEW  COMPARISON:  None.  FINDINGS: The humerus is well visualized and within normal limits. Degenerative changes of the acromioclavicular joint are seen. No gross soft tissue abnormality is noted.  IMPRESSION: No acute abnormality seen.   Electronically Signed   By: Inez Catalina M.D.   On: 03/17/2014 17:12   Dg Hand Complete Right  03/17/2014   CLINICAL DATA:  Pain post fall  EXAM: RIGHT HAND - COMPLETE 3+ VIEW  COMPARISON:  None.  FINDINGS: Three views  of the right hand submitted. No acute fracture or subluxation. There is narrowing of radiocarpal joint space. Diffuse osteopenia. Mild chondrocalcinosis. Degenerative changes first carpometacarpal joint. Degenerative changes anterior aspect of scaphoid. Degenerative changes proximal and distal interphalangeal joints.  IMPRESSION: No acute fracture or subluxation. Diffuse osteopenia. Degenerative changes as described above.   Electronically Signed   By: Orlean Bradford.D.  On: 03/17/2014 17:15   Dg Hip Unilat With Pelvis 2-3 Views Left  03/17/2014   CLINICAL DATA:  Status post fall. Left hip pain with limited range of motion. Initial encounter.  EXAM: DG HIP W/ PELVIS 2-3V*L*  COMPARISON:  None.  FINDINGS: The bones are demineralized. There is deformity of the left femoral neck suspicious for a fracture. This is not definitive and could be related to an old injury. There is no evidence of pelvic fracture or dislocation. Mild degenerative changes are present at the hips and sacroiliac joints. There are diffuse vascular calcifications. Postsurgical changes are noted related to left inguinal hernia repair.  IMPRESSION: Left femoral neck deformity suspicious for a nondisplaced fracture, age indeterminate. CT suggested for further evaluation.   Electronically Signed   By: Camie Patience M.D.   On: 03/17/2014 17:15    Review of Systems  Constitutional: Negative.   HENT: Positive for hearing loss. Negative for ear discharge, ear pain, nosebleeds, sore throat and tinnitus.   Eyes: Negative.   Respiratory: Negative for cough, hemoptysis, sputum production, shortness of breath and wheezing.   Cardiovascular: Negative for chest pain, palpitations, orthopnea and leg swelling.  Gastrointestinal: Negative for nausea and abdominal pain.  Musculoskeletal:       Patient complains of left wrist fracture radiating into the lateral forearm region. He denies any significant left hip pain.  Skin:       Easily bruises given  history of chronic anticoagulation  Neurological: Negative.  Negative for headaches.  Endo/Heme/Allergies: Bruises/bleeds easily.   Blood pressure 104/75, pulse 65, temperature 97.4 F (36.3 C), temperature source Oral, resp. rate 22, SpO2 90 %. Physical Exam  Physical examination shows that he is an extremely pleasant gentleman, he is alert and oriented 3, his daughter accompanies him in the room HEENT: He has slight swelling and ecchymosis superior to the left orbit, extraocular motion is intact Chest: Equal expansions are noted respirations are nonlabored Abdomen: Nontender Left upper extremity: Patient has diffuse ecchymosis about the hand and forearm with small skin tears less than 1 cm about the dorsal hand in addition abrasions about the index and middle finger dorsal MCP regions, the patient's very guarded in regards to the upper extremity he denies numbness or tingling about the radian ulnar or median nerve distributions. He has diffuse gross changes consistent with osteoarthritic DIP joint, he has mild swelling over the lateral condylar region of the elbow with slight ecchymosis and mild tenderness with palpation gentle flexion and extension are not overly painful about the elbow, supination and pronation is not attempted given the acute wrist fracture. Right upper extremity: The patient has diffuse generalized ecchymosis about the hand and forearm region. In addition evaluation of the right middle finger shows that he has swelling and ecchymosis about the distal tip and sensation refill are intact distal portion of his nail plate is slightly avulsed and trimmed at bedside has less than a 10% subungual hematoma present Evaluation of lower extremity shows he has brawny skin changes about the ankle in addition he is tender about the left knee however is nontender with internal/external rotation about the hips is weak with him right leg raise he is nontender about the right hip with  internal/external rotation. Currently, Dr. Rolena Infante is evaluating the patient is well given the possibility of an acute left femoral neck fracture, however given his examination certainly one queries whether this is chronic.  Assessment/Plan: Left distal radius fracture intra-articular in nature with mild displacement Right middle finger  nondisplaced DIP fracture after reviewing the radiographs at length with associated less than 10% subungual hematoma and distal nail plate disarray Left impacted femoral neck fracture question chronic in nature with notable arthrosis Discussed with patient at length the nature of his left upper extremity predicament, at this juncture we will plan to proceed with conservative route of care given there is no significant displacement, angulation and that this fracture is closed. Thus I have taken 30 minutes in duration cleaning the left upper extremity applying bacitracin, nonadherent dressings to the abrasions and skin tears and following this, I have placed him into a well molded short arm splint in neutral to slight extension about the wrist. He tolerated this well and there were no complications. Attention was then focused to the right middle finger. The distal tip of the finger was cleansed the nail plate was then trimmed irrigation was implemented following this  bacitracin was applied followed by a soft dressing well-padded in nature, fingertip splint is currently pending. We'll plan for repeat radiographs to the left elbow, one cannot rule out a nondisplaced occult fracture of the radial head at this juncture. We Discussed with his daughter recommendations for conservative route of care in regards to the wrist and finger. We'll plan for immobilization to the left wrist as well as distal tip of the right middle finger with serial radiographs into the future and wound care. I discussed with the patient and his daughter the need for diligent elevation, edema control and  gentle range of motion about the left hand/fingers. Plan for admission sling while inpatient. Further management of the hip will be deferred to general orthopedics. All questions were encouraged with the family and discussed at length. Patient Active Problem List   Diagnosis Date Noted  . Left displaced femoral neck fracture 03/17/2014  . Distal radius fracture, left 03/17/2014  . Hyperlipidemia 03/17/2014  . Closed left hip fracture 03/17/2014  . PROSTATE CANCER 07/01/2009  . HYPERCHOLESTEROLEMIA 07/01/2009  . Essential hypertension 07/01/2009  . MYOCARDIAL INFARCTION 07/01/2009  . Coronary atherosclerosis 07/01/2009  . Atrial fibrillation 07/01/2009  . GERD 07/01/2009  . HIATAL HERNIA 07/01/2009  . CHOLECYSTITIS 07/01/2009   Tyrone Balash L 03/17/2014, 7:27 PM

## 2014-03-17 NOTE — ED Provider Notes (Signed)
CSN: [id number]     Arrival date & time 03/17/14  1433 History   First MD Initiated Contact with Patient 03/17/14 1503     Chief Complaint  Patient presents with  . Fall     (Consider location/radiation/quality/duration/timing/severity/associated sxs/prior Treatment) Patient is a 81 y.o. male presenting with arm injury and head injury. The history is provided by the patient and a relative. No language interpreter was used.  Arm Injury Location:  Wrist Injury: yes   Mechanism of injury: fall   Fall:    Fall occurred:  Walking   Height of fall:  2 feet   Impact surface:  Product manager of impact:  Head and outstretched arms   Entrapped after fall: no   Wrist location:  L wrist Handedness:  Right-handed Dislocation: no   Tetanus status:  Up to date Prior injury to area:  No Relieved by:  Nothing Worsened by:  Nothing tried Ineffective treatments:  None tried Associated symptoms: swelling   Associated symptoms: no back pain, no decreased range of motion, no fever, no neck pain and no numbness   Risk factors: no frequent fractures   Head Injury Location:  Frontal Mechanism of injury: fall   Pain details:    Quality:  Aching   Severity:  Moderate   Timing:  Constant   Progression:  Unchanged Associated symptoms: headache   Associated symptoms: no neck pain and no numbness     Past Medical History  Diagnosis Date  . Hypertension   . High cholesterol   . Heart trouble   . Osteoarthritis   . Bone cancer   . Bilateral swelling of feet    Past Surgical History  Procedure Laterality Date  . Cholecystectomy     No family history on file. History  Substance Use Topics  . Smoking status: Never Smoker   . Smokeless tobacco: Never Used  . Alcohol Use: Yes     Comment: occassional    Review of Systems  Constitutional: Negative for fever and chills.  Respiratory: Negative for cough and shortness of breath.   Cardiovascular: Negative for chest pain and leg swelling.   Gastrointestinal: Negative for abdominal pain.  Genitourinary: Negative for dysuria, urgency and frequency.  Musculoskeletal: Negative for back pain and neck pain.  Skin: Negative for color change and wound.  Neurological: Positive for headaches. Negative for weakness and numbness.  All other systems reviewed and are negative.     Allergies  Review of patient's allergies indicates no known allergies.  Home Medications   Prior to Admission medications   Medication Sig Start Date End Date Taking? Authorizing Provider  atorvastatin (LIPITOR) 10 MG tablet Take 10 mg by mouth daily. 02/03/14  Yes Historical Provider, MD  CHONDROITIN SULFATE PO Take 1 tablet by mouth 2 (two) times daily.    Yes Historical Provider, MD  diltiazem (CARDIZEM) 60 MG tablet Take 60 mg by mouth 2 (two) times daily.  02/25/14  Yes Historical Provider, MD  Glucosamine 750 MG TABS Take 750 mg by mouth 3 (three) times daily.   Yes Historical Provider, MD  metoprolol (LOPRESSOR) 50 MG tablet Take 25 mg by mouth 2 (two) times daily.   Yes Historical Provider, MD  Multiple Vitamin (MULTIVITAMIN WITH MINERALS) TABS tablet Take 1 tablet by mouth daily.   Yes Historical Provider, MD  Multiple Vitamins-Minerals (PRESERVISION AREDS 2) CAPS Take 1 tablet by mouth 2 (two) times daily.   Yes Historical Provider, MD  Omega 3 1200 MG  CAPS Take 1,200 mg by mouth daily.   Yes Historical Provider, MD  omeprazole (PRILOSEC OTC) 20 MG tablet Take 20 mg by mouth daily.   Yes Historical Provider, MD  psyllium (METAMUCIL) 58.6 % packet Take 1 packet by mouth daily.   Yes Historical Provider, MD  warfarin (COUMADIN) 5 MG tablet Take 5-7.5 mg by mouth every morning. Takes 5mg  on Sun and Tues Takes 7.5mg  all other days   Yes Historical Provider, MD   BP 114/78 mmHg  Pulse 96  Temp(Src) 97.7 F (36.5 C) (Oral)  Resp 20  SpO2 95% Physical Exam  Constitutional: He is oriented to person, place, and time. He appears well-developed and  well-nourished. No distress.  HENT:  Head: Normocephalic. Head is with contusion (left frontal). Head is without abrasion.  Right Ear: Tympanic membrane normal.  Left Ear: Tympanic membrane normal.  Nose: No nasal deformity, septal deviation or nasal septal hematoma.  Eyes: Pupils are equal, round, and reactive to light.  Neck: No spinous process tenderness and no muscular tenderness present.  Cardiovascular: Normal rate, regular rhythm and normal heart sounds.   Pulses:      Dorsalis pedis pulses are 1+ on the right side, and 1+ on the left side.       Posterior tibial pulses are 1+ on the right side, and 1+ on the left side.  Pulmonary/Chest: Effort normal. No respiratory distress. He has no wheezes. He exhibits no tenderness, no laceration and no deformity.  Abdominal: Normal appearance. There is no tenderness. There is no rigidity, no rebound and no guarding.  Musculoskeletal:       Left wrist: He exhibits tenderness and swelling. He exhibits no crepitus and no deformity.       Left hip: He exhibits tenderness. He exhibits normal range of motion, normal strength, no swelling and no deformity.       Left knee: He exhibits normal range of motion, no swelling, no effusion, no ecchymosis and no deformity. Tenderness found.       Cervical back: He exhibits no tenderness, no bony tenderness, no deformity, no laceration and no pain.       Thoracic back: He exhibits no tenderness, no bony tenderness, no swelling, no deformity and no laceration.       Lumbar back: He exhibits no tenderness, no bony tenderness, no deformity and no laceration.       Left foot: There is tenderness. There is normal range of motion, no swelling and no deformity.  Neurological: He is alert and oriented to person, place, and time. He has normal strength. No cranial nerve deficit or sensory deficit.  Skin: Skin is warm and dry. He is not diaphoretic.  Multiple superficial brasions to the right hand and the left hand.   Three small skin tears to the posterior aspect of the left hand.  Hemostatic.    Nursing note and vitals reviewed.   ED Course  Procedures (including critical care time) Labs Review Labs Reviewed  CBC WITH DIFFERENTIAL - Abnormal; Notable for the following:    RBC 4.13 (*)    Platelets 102 (*)    Neutrophils Relative % 80 (*)    All other components within normal limits  BASIC METABOLIC PANEL - Abnormal; Notable for the following:    Glucose, Bld 168 (*)    GFR calc non Af Amer 73 (*)    GFR calc Af Amer 84 (*)    All other components within normal limits  PROTIME-INR - Abnormal;  Notable for the following:    Prothrombin Time 25.8 (*)    INR 2.33 (*)    All other components within normal limits  PROTIME-INR - Abnormal; Notable for the following:    Prothrombin Time 26.8 (*)    INR 2.45 (*)    All other components within normal limits  APTT - Abnormal; Notable for the following:    aPTT 48 (*)    All other components within normal limits  CBC - Abnormal; Notable for the following:    WBC 11.8 (*)    Platelets 72 (*)    All other components within normal limits  BASIC METABOLIC PANEL - Abnormal; Notable for the following:    Glucose, Bld 193 (*)    GFR calc non Af Amer 69 (*)    GFR calc Af Amer 80 (*)    All other components within normal limits  PROTIME-INR - Abnormal; Notable for the following:    Prothrombin Time 26.2 (*)    INR 2.38 (*)    All other components within normal limits    Imaging Review Dg Chest 2 View  03/17/2014   CLINICAL DATA:  [place] today with chest pain  EXAM: CHEST  2 VIEW  COMPARISON:  08/23/2009  FINDINGS: Cardiac shadow remains enlarged. Postsurgical changes are again noted. Mild interstitial changes are seen without focal infiltrate or sizable effusion. No acute bony abnormality is noted.  IMPRESSION: No acute abnormality seen.   Electronically Signed   By: Inez Catalina M.D.   On: 03/17/2014 17:11   Dg Forearm Left  03/17/2014   CLINICAL DATA:   Wrist and distal forearm pain after falling.  EXAM: LEFT FOREARM - 2 VIEW  COMPARISON:  None.  FINDINGS: The bones are demineralized. There is a mildly impacted intra-articular fracture of the distal radius. There is a nondisplaced ulnar styloid fracture. No proximal injuries are identified. The alignment appears normal at the elbow. There is scapholunate diastasis. Scattered vascular calcifications are noted.  IMPRESSION: Impacted intra-articular fracture of the distal radius with nondisplaced ulnar styloid fracture.   Electronically Signed   By: Camie Patience M.D.   On: 03/17/2014 17:12   Dg Wrist Complete Left  03/17/2014   CLINICAL DATA:  Status post fall today with severe left wrist pain. Initial encounter.  EXAM: LEFT WRIST - COMPLETE 3+ VIEW  COMPARISON:  None.  FINDINGS: The patient has an acute and mildly impacted distal radius fracture with intra-articular extension. No other fracture is identified. There is marked soft tissue swelling about the wrist. First CMC and scaphoid trapezium trapezoid joint osteoarthritis is noted.  IMPRESSION: Mildly impacted distal radius fracture with intra-articular involvement and associated soft tissue swelling.   Electronically Signed   By: Inge Rise M.D.   On: 03/17/2014 17:12   Dg Ankle Complete Left  03/17/2014   CLINICAL DATA:  Left ankle pain following fall, initial encounter  EXAM: LEFT ANKLE COMPLETE - 3+ VIEW  COMPARISON:  None.  FINDINGS: Mild soft tissue swelling is noted laterally. No acute fracture or dislocation is seen. Diffuse vascular calcifications are noted.  IMPRESSION: Soft tissue swelling without acute bony abnormality.   Electronically Signed   By: Inez Catalina M.D.   On: 03/17/2014 17:18   Ct Head Wo Contrast  03/17/2014   CLINICAL DATA:  Status post fall with a blow to the head.  EXAM: CT HEAD WITHOUT CONTRAST  CT CERVICAL SPINE WITHOUT CONTRAST  TECHNIQUE: Multidetector CT imaging of the head and cervical spine  was performed  following the standard protocol without intravenous contrast. Multiplanar CT image reconstructions of the cervical spine were also generated.  COMPARISON:  Chest CT scan 08/03/2006.  FINDINGS: CT HEAD FINDINGS  Contusion is seen over the left frontal bone without underlying fracture. The brain is atrophic with chronic microvascular ischemic change. No evidence of acute intracranial abnormality including infarct, hemorrhage, mass lesion, mass effect, midline shift or abnormal extra-axial fluid collection. No hydrocephalus or pneumocephalus. Imaged paranasal sinuses and mastoid air cells are clear.  CT CERVICAL SPINE FINDINGS  There is no fracture or malalignment of the cervical spine. Mild cervical spondylosis predominantly involves the facet joints. Lung apices demonstrate some scarring on the left, unchanged, and scattered ground-glass attenuation.  IMPRESSION: Hematoma over the left frontal bone without underlying fracture or acute intracranial abnormality.  No acute abnormality cervical spine.   Electronically Signed   By: Inge Rise M.D.   On: 03/17/2014 16:10   Ct Cervical Spine Wo Contrast  03/17/2014   CLINICAL DATA:  Status post fall with a blow to the head.  EXAM: CT HEAD WITHOUT CONTRAST  CT CERVICAL SPINE WITHOUT CONTRAST  TECHNIQUE: Multidetector CT imaging of the head and cervical spine was performed following the standard protocol without intravenous contrast. Multiplanar CT image reconstructions of the cervical spine were also generated.  COMPARISON:  Chest CT scan 08/03/2006.  FINDINGS: CT HEAD FINDINGS  Contusion is seen over the left frontal bone without underlying fracture. The brain is atrophic with chronic microvascular ischemic change. No evidence of acute intracranial abnormality including infarct, hemorrhage, mass lesion, mass effect, midline shift or abnormal extra-axial fluid collection. No hydrocephalus or pneumocephalus. Imaged paranasal sinuses and mastoid air cells are clear.   CT CERVICAL SPINE FINDINGS  There is no fracture or malalignment of the cervical spine. Mild cervical spondylosis predominantly involves the facet joints. Lung apices demonstrate some scarring on the left, unchanged, and scattered ground-glass attenuation.  IMPRESSION: Hematoma over the left frontal bone without underlying fracture or acute intracranial abnormality.  No acute abnormality cervical spine.   Electronically Signed   By: Inge Rise M.D.   On: 03/17/2014 16:10   Ct Hip Left Wo Contrast  03/17/2014   CLINICAL DATA:  Patient fell today. Questionable left hip fracture on radiographs.  EXAM: CT OF THE LEFT HIP WITHOUT CONTRAST  TECHNIQUE: Multidetector CT imaging of the left hip was performed according to the standard protocol. Multiplanar CT image reconstructions were also generated.  COMPARISON:  Current left hip radiographs  FINDINGS: Subcapital fracture of the left femoral neck. 3 shows slight impaction and apex anterior angulation. No significant comminution.  No other fractures.  Left hip joint normally aligned.  Mild subcutaneous edema lateral to the greater trochanter.  No soft tissue hematoma.  There are dense vascular calcifications.  Bones are diffusely demineralized.  IMPRESSION: 1. Subcapital left femoral neck fracture without significant displacement. There is slight impaction and apex anterior angulation. No comminution.   Electronically Signed   By: Lajean Manes M.D.   On: 03/17/2014 21:10   Chest Portable 1 View  03/18/2014   CLINICAL DATA:  Recent fall with multiple wound fractures  EXAM: PORTABLE CHEST - 1 VIEW  COMPARISON:  03/17/14  FINDINGS: Cardiomegaly is again noted. Tortuosity of the thoracic aorta with calcification is seen. Increased vascular congestion is noted as well as some bibasilar atelectatic changes. No sizable effusion is noted. No acute bony abnormality is seen.  IMPRESSION: Increasing vascular congestion with bibasilar atelectatic changes.  Electronically  Signed   By: Inez Catalina M.D.   On: 03/18/2014 08:07   Dg Knee Complete 4 Views Left  03/17/2014   CLINICAL DATA:  Fall, severe pain  EXAM: LEFT KNEE - COMPLETE 4+ VIEW  COMPARISON:  None.  FINDINGS: Four views of the left knee submitted. No acute fracture or subluxation. There is diffuse osteopenia. Significant narrowing of medial joint compartment. Mild chondrocalcinosis. Mild spurring of medial femoral condyle and medial tibial plateau. Narrowing of patellofemoral joint space. No joint effusion. Atherosclerotic calcifications of femoral and popliteal artery.  IMPRESSION: No acute fracture or subluxation. Diffuse osteopenia. Osteoarthritic changes as described above. Mild chondrocalcinosis.   Electronically Signed   By: Lahoma Crocker M.D.   On: 03/17/2014 17:13   Dg Humerus Left  03/17/2014   CLINICAL DATA:  Recent fall with left arm pain  EXAM: LEFT HUMERUS - 2+ VIEW  COMPARISON:  None.  FINDINGS: The humerus is well visualized and within normal limits. Degenerative changes of the acromioclavicular joint are seen. No gross soft tissue abnormality is noted.  IMPRESSION: No acute abnormality seen.   Electronically Signed   By: Inez Catalina M.D.   On: 03/17/2014 17:12   Dg Hand Complete Right  03/17/2014   CLINICAL DATA:  Pain post fall  EXAM: RIGHT HAND - COMPLETE 3+ VIEW  COMPARISON:  None.  FINDINGS: Three views of the right hand submitted. No acute fracture or subluxation. There is narrowing of radiocarpal joint space. Diffuse osteopenia. Mild chondrocalcinosis. Degenerative changes first carpometacarpal joint. Degenerative changes anterior aspect of scaphoid. Degenerative changes proximal and distal interphalangeal joints.  IMPRESSION: No acute fracture or subluxation. Diffuse osteopenia. Degenerative changes as described above.   Electronically Signed   By: Lahoma Crocker M.D.   On: 03/17/2014 17:15   Dg Hip Unilat With Pelvis 2-3 Views Left  03/17/2014   CLINICAL DATA:  Status post fall. Left hip pain  with limited range of motion. Initial encounter.  EXAM: DG HIP W/ PELVIS 2-3V*L*  COMPARISON:  None.  FINDINGS: The bones are demineralized. There is deformity of the left femoral neck suspicious for a fracture. This is not definitive and could be related to an old injury. There is no evidence of pelvic fracture or dislocation. Mild degenerative changes are present at the hips and sacroiliac joints. There are diffuse vascular calcifications. Postsurgical changes are noted related to left inguinal hernia repair.  IMPRESSION: Left femoral neck deformity suspicious for a nondisplaced fracture, age indeterminate. CT suggested for further evaluation.   Electronically Signed   By: Camie Patience M.D.   On: 03/17/2014 17:15     EKG Interpretation None      MDM   Final diagnoses:  Fall  Left comminuted distal radius fracture Left femoral neck fracture Left frontal scalp hematoma Skin tear of left hand Right 1st digit abrasion   Patient is a 81 year old Caucasian male with a pertinent past medical history of hypertension, hyperlipidemia, and cardiac disease treated with Coumadin who comes to the emergency department today with a fall. Initial workup included a CBC, BMP, PT/INR, x-ray of the left wrist, x-ray of the left forearm, x-ray of the left humerus, x-ray of the right hand, x-ray of the left hip, x-ray of the left ankle, CT of the head, and a CT of the C-spine.  Patient had a mechanical fall as result I do not feel that he requires a syncopal evaluation at this time. CBC with a hemoglobin of 13 otherwise unremarkable. BMP was  unremarkable. INR was 2.3 otherwise unremarkable.  X-rays demonstrated a left frontal hematoma but no intracranial hemorrhage. No C-spine injury. There was a left impacted comminuted intra-articular distal radius fracture. There was a left femoral neck fracture. Orthopedic surgery was consulted for the left femoral neck fracture. They requested that hand be involved with the  distal radius fracture and that medicine admit the patient to their service for them to evaluate in the morning. Patient will be nonweightbearing until then.  Hand was consulted.  They evaluated the patient in the emergency department for his distal radius fracture. The patient's care was discussed with medicine and was admitted to their service, in good condition.  Labs and imaging were reviewed by myself and considered in medical decision-making. Imaging was interpreted radiology. Care was discussed with my attending Dr. Darl Householder.      Katheren Shams, MD 03/18/14 [id number]  Wandra Arthurs, MD 03/18/14 [phone]

## 2014-03-17 NOTE — Progress Notes (Signed)
ANTICOAGULATION CONSULT NOTE - Initial Consult  Pharmacy Consult for coumadin Indication: atrial fibrillation  No Known Allergies  Patient Measurements:   Heparin Dosing Weight:   Vital Signs: Temp: 97.4 F (36.3 C) (01/19 1455) Temp Source: Oral (01/19 1455) BP: 104/75 mmHg (01/19 1915) Pulse Rate: 65 (01/19 1915)  Labs:  Recent Labs  03/17/14 1525  HGB 13.1  HCT 39.4  PLT 102*  LABPROT 25.8*  INR 2.33*  CREATININE 0.81    CrCl cannot be calculated (Unknown ideal weight.).   Medical History: Past Medical History  Diagnosis Date  . Hypertension   . High cholesterol   . Heart trouble   . Osteoarthritis   . Bone cancer   . Bilateral swelling of feet     Medications:  Scheduled:  . diltiazem  180 mg Oral Daily  . docusate sodium  100 mg Oral BID  . metoprolol tartrate  25 mg Oral BID  . pantoprazole  40 mg Oral Daily  . psyllium  1 packet Oral Daily  . [START ON 03/18/2014] rosuvastatin  10 mg Oral q1800   Infusions:    Assessment: 81 yo male with hx of afib will be continued on coumadin.  Patient is s/p fall but team thinks hip fracture is unlikely.  INR today is 2.33.  Patient was on coumadin 7.5 mg daily except 5 mg on Tuesdays and Sundays prior to admission.  Last coumadin dose was 03/17/14  Goal of Therapy:  INR 2-3 Monitor platelets by anticoagulation protocol: Yes   Plan:  - Resume home coumadin dosing of 7.5 mg daily except 5 mg on Tuesdays and Sundays starting tomorrow - Daily PT/INR for now - f/u plan if surgery is need or not  Ethen Bannan, Tsz-Yin 03/17/2014,9:16 PM

## 2014-03-17 NOTE — Consult Note (Signed)
No primary care provider on file. Chief Complaint: fall History: Elderly gentlemen s/p fall at home.  No LOC.  Complains of left wrist pain. Question of hip fracture raised and so ortho consult requested. Past Medical History  Diagnosis Date  . Hypertension   . High cholesterol   . Heart trouble   . Osteoarthritis   . Bone cancer   . Bilateral swelling of feet     No Known Allergies  No current facility-administered medications on file prior to encounter.   Current Outpatient Prescriptions on File Prior to Encounter  Medication Sig Dispense Refill  . Acetaminophen (TYLENOL 8 HOUR PO) Take by mouth.      . CHONDROITIN SULFATE PO Take by mouth.      Marland Kitchen DIAZEPAM IM Inject into the muscle.      . Glucosamine-Fish Oil-EPA-DHA (GLUCOSAMINE & FISH OIL PO) Take by mouth.      . METOPROLOL SUCCINATE PO Take by mouth.      . Multiple Vitamin (MULTIVITAMIN) tablet Take 1 tablet by mouth daily.      . Multiple Vitamins-Minerals (CENTRUM SILVER PO) Take by mouth.      . Omega-3 Fatty Acids (OMEGA 3 PO) Take by mouth.      . Psyllium (METAMUCIL PO) Take by mouth.      . Rosuvastatin Calcium (CRESTOR PO) Take by mouth.      . Warfarin Sodium (COUMADIN PO) Take by mouth.        Physical Exam: Filed Vitals:   03/17/14 1730  BP: 114/74  Pulse: 38  Temp:   Resp: 26   A+O X3 NVI Compartments soft/nt No SOB/CP Abd soft/NT Left wrist in splint for acute fracture No hip pain with rotation, flex/ext or palpation No deformity or shortening of the extremity Image: Dg Chest 2 View  03/17/2014   CLINICAL DATA:  [place] today with chest pain  EXAM: CHEST  2 VIEW  COMPARISON:  08/23/2009  FINDINGS: Cardiac shadow remains enlarged. Postsurgical changes are again noted. Mild interstitial changes are seen without focal infiltrate or sizable effusion. No acute bony abnormality is noted.  IMPRESSION: No acute abnormality seen.   Electronically Signed   By: Inez Catalina M.D.   On: 03/17/2014 17:11    Dg Forearm Left  03/17/2014   CLINICAL DATA:  Wrist and distal forearm pain after falling.  EXAM: LEFT FOREARM - 2 VIEW  COMPARISON:  None.  FINDINGS: The bones are demineralized. There is a mildly impacted intra-articular fracture of the distal radius. There is a nondisplaced ulnar styloid fracture. No proximal injuries are identified. The alignment appears normal at the elbow. There is scapholunate diastasis. Scattered vascular calcifications are noted.  IMPRESSION: Impacted intra-articular fracture of the distal radius with nondisplaced ulnar styloid fracture.   Electronically Signed   By: Camie Patience M.D.   On: 03/17/2014 17:12   Dg Wrist Complete Left  03/17/2014   CLINICAL DATA:  Status post fall today with severe left wrist pain. Initial encounter.  EXAM: LEFT WRIST - COMPLETE 3+ VIEW  COMPARISON:  None.  FINDINGS: The patient has an acute and mildly impacted distal radius fracture with intra-articular extension. No other fracture is identified. There is marked soft tissue swelling about the wrist. First CMC and scaphoid trapezium trapezoid joint osteoarthritis is noted.  IMPRESSION: Mildly impacted distal radius fracture with intra-articular involvement and associated soft tissue swelling.   Electronically Signed   By: Inge Rise M.D.   On: 03/17/2014 17:12   Dg  Ankle Complete Left  03/17/2014   CLINICAL DATA:  Left ankle pain following fall, initial encounter  EXAM: LEFT ANKLE COMPLETE - 3+ VIEW  COMPARISON:  None.  FINDINGS: Mild soft tissue swelling is noted laterally. No acute fracture or dislocation is seen. Diffuse vascular calcifications are noted.  IMPRESSION: Soft tissue swelling without acute bony abnormality.   Electronically Signed   By: Inez Catalina M.D.   On: 03/17/2014 17:18   Ct Head Wo Contrast  03/17/2014   CLINICAL DATA:  Status post fall with a blow to the head.  EXAM: CT HEAD WITHOUT CONTRAST  CT CERVICAL SPINE WITHOUT CONTRAST  TECHNIQUE: Multidetector CT imaging of  the head and cervical spine was performed following the standard protocol without intravenous contrast. Multiplanar CT image reconstructions of the cervical spine were also generated.  COMPARISON:  Chest CT scan 08/03/2006.  FINDINGS: CT HEAD FINDINGS  Contusion is seen over the left frontal bone without underlying fracture. The brain is atrophic with chronic microvascular ischemic change. No evidence of acute intracranial abnormality including infarct, hemorrhage, mass lesion, mass effect, midline shift or abnormal extra-axial fluid collection. No hydrocephalus or pneumocephalus. Imaged paranasal sinuses and mastoid air cells are clear.  CT CERVICAL SPINE FINDINGS  There is no fracture or malalignment of the cervical spine. Mild cervical spondylosis predominantly involves the facet joints. Lung apices demonstrate some scarring on the left, unchanged, and scattered ground-glass attenuation.  IMPRESSION: Hematoma over the left frontal bone without underlying fracture or acute intracranial abnormality.  No acute abnormality cervical spine.   Electronically Signed   By: Inge Rise M.D.   On: 03/17/2014 16:10   Ct Cervical Spine Wo Contrast  03/17/2014   CLINICAL DATA:  Status post fall with a blow to the head.  EXAM: CT HEAD WITHOUT CONTRAST  CT CERVICAL SPINE WITHOUT CONTRAST  TECHNIQUE: Multidetector CT imaging of the head and cervical spine was performed following the standard protocol without intravenous contrast. Multiplanar CT image reconstructions of the cervical spine were also generated.  COMPARISON:  Chest CT scan 08/03/2006.  FINDINGS: CT HEAD FINDINGS  Contusion is seen over the left frontal bone without underlying fracture. The brain is atrophic with chronic microvascular ischemic change. No evidence of acute intracranial abnormality including infarct, hemorrhage, mass lesion, mass effect, midline shift or abnormal extra-axial fluid collection. No hydrocephalus or pneumocephalus. Imaged paranasal  sinuses and mastoid air cells are clear.  CT CERVICAL SPINE FINDINGS  There is no fracture or malalignment of the cervical spine. Mild cervical spondylosis predominantly involves the facet joints. Lung apices demonstrate some scarring on the left, unchanged, and scattered ground-glass attenuation.  IMPRESSION: Hematoma over the left frontal bone without underlying fracture or acute intracranial abnormality.  No acute abnormality cervical spine.   Electronically Signed   By: Inge Rise M.D.   On: 03/17/2014 16:10   Dg Knee Complete 4 Views Left  03/17/2014   CLINICAL DATA:  Fall, severe pain  EXAM: LEFT KNEE - COMPLETE 4+ VIEW  COMPARISON:  None.  FINDINGS: Four views of the left knee submitted. No acute fracture or subluxation. There is diffuse osteopenia. Significant narrowing of medial joint compartment. Mild chondrocalcinosis. Mild spurring of medial femoral condyle and medial tibial plateau. Narrowing of patellofemoral joint space. No joint effusion. Atherosclerotic calcifications of femoral and popliteal artery.  IMPRESSION: No acute fracture or subluxation. Diffuse osteopenia. Osteoarthritic changes as described above. Mild chondrocalcinosis.   Electronically Signed   By: Lahoma Crocker M.D.   On: 03/17/2014  17:13   Dg Humerus Left  03/17/2014   CLINICAL DATA:  Recent fall with left arm pain  EXAM: LEFT HUMERUS - 2+ VIEW  COMPARISON:  None.  FINDINGS: The humerus is well visualized and within normal limits. Degenerative changes of the acromioclavicular joint are seen. No gross soft tissue abnormality is noted.  IMPRESSION: No acute abnormality seen.   Electronically Signed   By: Inez Catalina M.D.   On: 03/17/2014 17:12   Dg Hand Complete Right  03/17/2014   CLINICAL DATA:  Pain post fall  EXAM: RIGHT HAND - COMPLETE 3+ VIEW  COMPARISON:  None.  FINDINGS: Three views of the right hand submitted. No acute fracture or subluxation. There is narrowing of radiocarpal joint space. Diffuse osteopenia. Mild  chondrocalcinosis. Degenerative changes first carpometacarpal joint. Degenerative changes anterior aspect of scaphoid. Degenerative changes proximal and distal interphalangeal joints.  IMPRESSION: No acute fracture or subluxation. Diffuse osteopenia. Degenerative changes as described above.   Electronically Signed   By: Lahoma Crocker M.D.   On: 03/17/2014 17:15   Dg Hip Unilat With Pelvis 2-3 Views Left  03/17/2014   CLINICAL DATA:  Status post fall. Left hip pain with limited range of motion. Initial encounter.  EXAM: DG HIP W/ PELVIS 2-3V*L*  COMPARISON:  None.  FINDINGS: The bones are demineralized. There is deformity of the left femoral neck suspicious for a fracture. This is not definitive and could be related to an old injury. There is no evidence of pelvic fracture or dislocation. Mild degenerative changes are present at the hips and sacroiliac joints. There are diffuse vascular calcifications. Postsurgical changes are noted related to left inguinal hernia repair.  IMPRESSION: Left femoral neck deformity suspicious for a nondisplaced fracture, age indeterminate. CT suggested for further evaluation.   Electronically Signed   By: Camie Patience M.D.   On: 03/17/2014 17:15    A/P:  CTSP for question of left hip fracture after a fall. Patient denies any significant hip pain with ROM and palpation Major complaint is left radius fracture ? Whether left hip is an acute injury Agree with radiologist - will order a CT scan Unlikely given lack of pain that this is an acute injury that will require surgery Further recommendations pending review of CT scan.

## 2014-03-17 NOTE — ED Notes (Signed)
Pt arrives from home via [facility], reports "tripped and fell" in driveway, denies LOC, back pain.  Pt c/o LUE pain from mid humerus to wrist, LLE pain from hip down, abrasions of both hands, hematoma L forehead.   L wrist appears swollen.  Pt states chronic L knee pain, home brace in place.

## 2014-03-17 NOTE — Progress Notes (Signed)
Orthopedic Tech Progress Note Patient Details:  [name] Chang 03-17-17 [id number]  Ortho Devices Type of Ortho Device: Ace wrap, Volar splint Ortho Device/Splint Location: LUE Ortho Device/Splint Interventions: Ordered, Application   Braulio Bosch 03/17/2014, 7:29 PM

## 2014-03-17 NOTE — H&P (Signed)
[facility] History and Physical  [name] PPJ:[id number] DOB: [birth year] DOA: 03/17/2014  Referring physician: Katheren Shams, MD PCP: No primary care provider on file.   Chief Complaint: Hip Pain  HPI: [name] Chang is a 81 y.o. male presents with a hip fracture. Patient staes he was getting into his car and didn't raise his foot high enough to avoid a uneven area in the cement. Patient states he landed on his left side with his arm extended. Patient states he hit his head also. Patient did not lose conscious ness. He was not dizzy or light headed. Patient states that right now he is having pain in his left wrist and also his left hip. On evalaution in the ED he has a NON-DISPLACED Femoral neck fracture. Patient also has a radial fracture on the left side and has some abrasions on the left side of his skull.   Review of Systems:  Constitutional:  No weight loss, night sweats, Fevers, chills, fatigue.  HEENT:  No headaches, Difficulty swallowing Cardio-vascular:  No chest pain, Orthopnea, PND, anasarca, dizziness, palpitations  GI:  No heartburn, indigestion, abdominal pain, nausea, vomiting  Resp:  No shortness of breath with exertion or at rest. No excess mucus, no productive cough, No non-productive cough, No coughing up of blood Skin:  no rash or lesions.  GU:  no dysuria, change in color of urine Musculoskeletal:  ++hip Pain. ++wrist pain.  Psych:  No change in mood or affect. No depression or anxiety.   Past Medical History  Diagnosis Date  . Hypertension   . High cholesterol   . Heart trouble   . Osteoarthritis   . Bone cancer   . Bilateral swelling of feet    Past Surgical History  Procedure Laterality Date  . Cholecystectomy     Social History:  reports that he has never smoked. He has never used smokeless tobacco. He reports that he drinks alcohol. His drug history is not on file.  No Known Allergies  No family history on file.   Prior to  Admission medications   Medication Sig Start Date End Date Taking? Authorizing Provider  Acetaminophen (TYLENOL 8 HOUR PO) Take by mouth.      Historical Provider, MD  CHONDROITIN SULFATE PO Take by mouth.      Historical Provider, MD  DIAZEPAM IM Inject into the muscle.      Historical Provider, MD  Glucosamine-Fish Oil-EPA-DHA (GLUCOSAMINE & FISH OIL PO) Take by mouth.      Historical Provider, MD  METOPROLOL SUCCINATE PO Take by mouth.      Historical Provider, MD  Multiple Vitamin (MULTIVITAMIN) tablet Take 1 tablet by mouth daily.      Historical Provider, MD  Multiple Vitamins-Minerals (CENTRUM SILVER PO) Take by mouth.      Historical Provider, MD  Omega-3 Fatty Acids (OMEGA 3 PO) Take by mouth.      Historical Provider, MD  Psyllium (METAMUCIL PO) Take by mouth.      Historical Provider, MD  Rosuvastatin Calcium (CRESTOR PO) Take by mouth.      Historical Provider, MD  Warfarin Sodium (COUMADIN PO) Take by mouth.      Historical Provider, MD   Physical Exam: Filed Vitals:   03/17/14 1530 03/17/14 1531 03/17/14 1715 03/17/14 1730  BP: 82/45 92/58 123/74 114/74  Pulse: 63 53 88 38  Temp:      TempSrc:      Resp: 19 20 25 26   SpO2: 92% 93%  93% 87%    Wt Readings from Last 3 Encounters:  02/10/10 86.183 kg (190 lb)  07/15/09 85.276 kg (188 lb)  07/01/09 87.544 kg (193 lb)    General:  Appears calm and comfortable Eyes: PERRL, normal lids, irises & conjunctiva ENT: grossly normal hearing, lips & tongue Neck: no LAD, masses or thyromegaly Cardiovascular: RRR, no m/r/g. No LE edema. Telemetry: Atrial Fibrillation Respiratory: CTA bilaterally, no w/r/r. Normal respiratory effort. Abdomen: soft, ntnd Skin: abrasions on scalp and on left hand Musculoskeletal: grossly normal tone BUE/BLE Psychiatric: grossly normal mood and affect, speech fluent and appropriate Neurologic: grossly non-focal.          Labs on Admission:  Basic Metabolic Panel:  Recent Labs Lab  03/17/14 1525  NA 137  K 4.4  CL 106  CO2 24  GLUCOSE 168*  BUN 17  CREATININE 0.81  CALCIUM 9.0   Liver Function Tests: No results for input(s): AST, ALT, ALKPHOS, BILITOT, PROT, ALBUMIN in the last 168 hours. No results for input(s): LIPASE, AMYLASE in the last 168 hours. No results for input(s): AMMONIA in the last 168 hours. CBC:  Recent Labs Lab 03/17/14 1525  WBC 8.1  NEUTROABS 6.4  HGB 13.1  HCT 39.4  MCV 95.4  PLT 102*   Cardiac Enzymes: No results for input(s): CKTOTAL, CKMB, CKMBINDEX, TROPONINI in the last 168 hours.  BNP (last 3 results) No results for input(s): PROBNP in the last 8760 hours. CBG: No results for input(s): GLUCAP in the last 168 hours.  Radiological Exams on Admission: Dg Chest 2 View  03/17/2014   CLINICAL DATA:  [place] today with chest pain  EXAM: CHEST  2 VIEW  COMPARISON:  08/23/2009  FINDINGS: Cardiac shadow remains enlarged. Postsurgical changes are again noted. Mild interstitial changes are seen without focal infiltrate or sizable effusion. No acute bony abnormality is noted.  IMPRESSION: No acute abnormality seen.   Electronically Signed   By: Inez Catalina M.D.   On: 03/17/2014 17:11   Dg Forearm Left  03/17/2014   CLINICAL DATA:  Wrist and distal forearm pain after falling.  EXAM: LEFT FOREARM - 2 VIEW  COMPARISON:  None.  FINDINGS: The bones are demineralized. There is a mildly impacted intra-articular fracture of the distal radius. There is a nondisplaced ulnar styloid fracture. No proximal injuries are identified. The alignment appears normal at the elbow. There is scapholunate diastasis. Scattered vascular calcifications are noted.  IMPRESSION: Impacted intra-articular fracture of the distal radius with nondisplaced ulnar styloid fracture.   Electronically Signed   By: Camie Patience M.D.   On: 03/17/2014 17:12   Dg Wrist Complete Left  03/17/2014   CLINICAL DATA:  Status post fall today with severe left wrist pain. Initial encounter.   EXAM: LEFT WRIST - COMPLETE 3+ VIEW  COMPARISON:  None.  FINDINGS: The patient has an acute and mildly impacted distal radius fracture with intra-articular extension. No other fracture is identified. There is marked soft tissue swelling about the wrist. First CMC and scaphoid trapezium trapezoid joint osteoarthritis is noted.  IMPRESSION: Mildly impacted distal radius fracture with intra-articular involvement and associated soft tissue swelling.   Electronically Signed   By: Inge Rise M.D.   On: 03/17/2014 17:12   Dg Ankle Complete Left  03/17/2014   CLINICAL DATA:  Left ankle pain following fall, initial encounter  EXAM: LEFT ANKLE COMPLETE - 3+ VIEW  COMPARISON:  None.  FINDINGS: Mild soft tissue swelling is noted laterally. No acute fracture or  dislocation is seen. Diffuse vascular calcifications are noted.  IMPRESSION: Soft tissue swelling without acute bony abnormality.   Electronically Signed   By: Inez Catalina M.D.   On: 03/17/2014 17:18   Ct Head Wo Contrast  03/17/2014   CLINICAL DATA:  Status post fall with a blow to the head.  EXAM: CT HEAD WITHOUT CONTRAST  CT CERVICAL SPINE WITHOUT CONTRAST  TECHNIQUE: Multidetector CT imaging of the head and cervical spine was performed following the standard protocol without intravenous contrast. Multiplanar CT image reconstructions of the cervical spine were also generated.  COMPARISON:  Chest CT scan 08/03/2006.  FINDINGS: CT HEAD FINDINGS  Contusion is seen over the left frontal bone without underlying fracture. The brain is atrophic with chronic microvascular ischemic change. No evidence of acute intracranial abnormality including infarct, hemorrhage, mass lesion, mass effect, midline shift or abnormal extra-axial fluid collection. No hydrocephalus or pneumocephalus. Imaged paranasal sinuses and mastoid air cells are clear.  CT CERVICAL SPINE FINDINGS  There is no fracture or malalignment of the cervical spine. Mild cervical spondylosis predominantly  involves the facet joints. Lung apices demonstrate some scarring on the left, unchanged, and scattered ground-glass attenuation.  IMPRESSION: Hematoma over the left frontal bone without underlying fracture or acute intracranial abnormality.  No acute abnormality cervical spine.   Electronically Signed   By: Inge Rise M.D.   On: 03/17/2014 16:10   Ct Cervical Spine Wo Contrast  03/17/2014   CLINICAL DATA:  Status post fall with a blow to the head.  EXAM: CT HEAD WITHOUT CONTRAST  CT CERVICAL SPINE WITHOUT CONTRAST  TECHNIQUE: Multidetector CT imaging of the head and cervical spine was performed following the standard protocol without intravenous contrast. Multiplanar CT image reconstructions of the cervical spine were also generated.  COMPARISON:  Chest CT scan 08/03/2006.  FINDINGS: CT HEAD FINDINGS  Contusion is seen over the left frontal bone without underlying fracture. The brain is atrophic with chronic microvascular ischemic change. No evidence of acute intracranial abnormality including infarct, hemorrhage, mass lesion, mass effect, midline shift or abnormal extra-axial fluid collection. No hydrocephalus or pneumocephalus. Imaged paranasal sinuses and mastoid air cells are clear.  CT CERVICAL SPINE FINDINGS  There is no fracture or malalignment of the cervical spine. Mild cervical spondylosis predominantly involves the facet joints. Lung apices demonstrate some scarring on the left, unchanged, and scattered ground-glass attenuation.  IMPRESSION: Hematoma over the left frontal bone without underlying fracture or acute intracranial abnormality.  No acute abnormality cervical spine.   Electronically Signed   By: Inge Rise M.D.   On: 03/17/2014 16:10   Dg Knee Complete 4 Views Left  03/17/2014   CLINICAL DATA:  Fall, severe pain  EXAM: LEFT KNEE - COMPLETE 4+ VIEW  COMPARISON:  None.  FINDINGS: Four views of the left knee submitted. No acute fracture or subluxation. There is diffuse osteopenia.  Significant narrowing of medial joint compartment. Mild chondrocalcinosis. Mild spurring of medial femoral condyle and medial tibial plateau. Narrowing of patellofemoral joint space. No joint effusion. Atherosclerotic calcifications of femoral and popliteal artery.  IMPRESSION: No acute fracture or subluxation. Diffuse osteopenia. Osteoarthritic changes as described above. Mild chondrocalcinosis.   Electronically Signed   By: Lahoma Crocker M.D.   On: 03/17/2014 17:13   Dg Humerus Left  03/17/2014   CLINICAL DATA:  Recent fall with left arm pain  EXAM: LEFT HUMERUS - 2+ VIEW  COMPARISON:  None.  FINDINGS: The humerus is well visualized and within normal limits. Degenerative  changes of the acromioclavicular joint are seen. No gross soft tissue abnormality is noted.  IMPRESSION: No acute abnormality seen.   Electronically Signed   By: Inez Catalina M.D.   On: 03/17/2014 17:12   Dg Hand Complete Right  03/17/2014   CLINICAL DATA:  Pain post fall  EXAM: RIGHT HAND - COMPLETE 3+ VIEW  COMPARISON:  None.  FINDINGS: Three views of the right hand submitted. No acute fracture or subluxation. There is narrowing of radiocarpal joint space. Diffuse osteopenia. Mild chondrocalcinosis. Degenerative changes first carpometacarpal joint. Degenerative changes anterior aspect of scaphoid. Degenerative changes proximal and distal interphalangeal joints.  IMPRESSION: No acute fracture or subluxation. Diffuse osteopenia. Degenerative changes as described above.   Electronically Signed   By: Lahoma Crocker M.D.   On: 03/17/2014 17:15   Dg Hip Unilat With Pelvis 2-3 Views Left  03/17/2014   CLINICAL DATA:  Status post fall. Left hip pain with limited range of motion. Initial encounter.  EXAM: DG HIP W/ PELVIS 2-3V*L*  COMPARISON:  None.  FINDINGS: The bones are demineralized. There is deformity of the left femoral neck suspicious for a fracture. This is not definitive and could be related to an old injury. There is no evidence of pelvic  fracture or dislocation. Mild degenerative changes are present at the hips and sacroiliac joints. There are diffuse vascular calcifications. Postsurgical changes are noted related to left inguinal hernia repair.  IMPRESSION: Left femoral neck deformity suspicious for a nondisplaced fracture, age indeterminate. CT suggested for further evaluation.   Electronically Signed   By: Camie Patience M.D.   On: 03/17/2014 17:15      Assessment/Plan Active Problems:   Essential hypertension   Coronary atherosclerosis   Atrial fibrillation   Left displaced femoral neck fracture   Distal radius fracture, left   Hyperlipidemia   1. Left NON-displaced fracture of the femoral neck -will admit to orthopedic floor -ortho consult -will keep NPO for now  2. Left Fracture of Radius -splinted in the ED -may not require surgery  3. Atrial Fibrillation -currently rate controlled -continue with home medications -will monitor  4. CAD -stable at this time  5. Hypertension -monitor pressures -will continue with home medications  6. Hyperlipidemia -continue with statins  7. Chronic Anticoagulation -on coumadin -may need to hold for now -will monitor   Code Status: Full Code (must indicate code status--if unknown or must be presumed, indicate so) DVT Prophylaxis:On Coumadin Family Communication: Daughter (indicate person spoken with, if applicable, with phone number if by telephone) Disposition Plan: SNF (indicate anticipated LOS)  Time spent: 31min  Almarie Kurdziel A [facility] Pager [phone]

## 2014-03-18 ENCOUNTER — Inpatient Hospital Stay (HOSPITAL_COMMUNITY): Payer: Medicare Other

## 2014-03-18 LAB — CBC
HEMATOCRIT: 40.1 % (ref 39.0–52.0)
Hemoglobin: 13.4 g/dL (ref 13.0–17.0)
MCH: 31.8 pg (ref 26.0–34.0)
MCHC: 33.4 g/dL (ref 30.0–36.0)
MCV: 95 fL (ref 78.0–100.0)
PLATELETS: 72 10*3/uL — AB (ref 150–400)
RBC: 4.22 MIL/uL (ref 4.22–5.81)
RDW: 14.4 % (ref 11.5–15.5)
WBC: 11.8 10*3/uL — ABNORMAL HIGH (ref 4.0–10.5)

## 2014-03-18 LAB — BASIC METABOLIC PANEL
Anion gap: 5 (ref 5–15)
BUN: 22 mg/dL (ref 6–23)
CHLORIDE: 104 meq/L (ref 96–112)
CO2: 27 mmol/L (ref 19–32)
CREATININE: 0.91 mg/dL (ref 0.50–1.35)
Calcium: 8.9 mg/dL (ref 8.4–10.5)
GFR calc non Af Amer: 69 mL/min — ABNORMAL LOW (ref >90)
GFR, EST AFRICAN AMERICAN: 80 mL/min — AB (ref >90)
Glucose, Bld: 193 mg/dL — ABNORMAL HIGH (ref 70–99)
POTASSIUM: 4.6 mmol/L (ref 3.5–5.1)
Sodium: 136 mmol/L (ref 135–145)

## 2014-03-18 LAB — PROTIME-INR
INR: 2.38 — AB (ref 0.00–1.49)
Prothrombin Time: 26.2 seconds — ABNORMAL HIGH (ref 11.6–15.2)

## 2014-03-18 MED ORDER — ENSURE COMPLETE PO LIQD
237.0000 mL | Freq: Two times a day (BID) | ORAL | Status: DC
  Administered 2014-03-18 – 2014-03-19 (×3): 237 mL via ORAL

## 2014-03-18 NOTE — Progress Notes (Signed)
INITIAL NUTRITION ASSESSMENT  DOCUMENTATION CODES Per approved criteria  -Not Applicable   INTERVENTION: Provide Ensure Complete po BID, each supplement provides 350 kcal and 13 grams of protein.  Recommend obtaining new weight to fully assess weight trends.  Encourage adequate PO intake.  NUTRITION DIAGNOSIS: Inadequate oral intake related to dislike of food served as evidenced by meal completion of 25%.   Goal: Pt to meet >/= 90% of their estimated nutrition needs   Monitor:  PO intake, weight trends, labs, I/O's  Reason for Assessment: MD consult  81 y.o. male  Admitting Dx: L Displaced Femoral Neck Fx  ASSESSMENT: Pt presents with a hip fracture. Patient states he landed on his left side with his arm extended. On evalaution in the ED he has a NON-DISPLACED Femoral neck fracture. Patient also has a radial fracture on the left side and has some abrasions on the left side of his skull. If pt does not tolerate ambulation, may need surgery.  Pt reports having a good appetite currently and PTA at home eating 3 meals a day. Meal completion has been 25%. Pt reports he did not like the food served. Pt is agreeable to Ensure to aid in caloric and protein needs as po intake has been inadequate. RD to order. Weight has been stable with usual body weight of 185 lbs. Pt was encouraged to eat his food at meals and to drink his supplements.  Pt with no observed significant fat or muscle mass loss.  Labs and medications reviewed.  Height: Ht Readings from Last 1 Encounters:  02/10/10 5\' 8"  (1.727 m)    Weight: Wt Readings from Last 1 Encounters:  02/10/10 190 lb (86.183 kg)    Ideal Body Weight: 154 lbs  % Ideal Body Weight: 123%  Wt Readings from Last 10 Encounters:  02/10/10 190 lb (86.183 kg)  07/15/09 188 lb (85.276 kg)  07/01/09 193 lb (87.544 kg)    Usual Body Weight: 185 lbs  % Usual Body Weight: 103%  BMI:  Body Mass Index: 29.15 kg(m^2)   Estimated  Nutritional Needs: Kcal: 1850-2050 Protein: 85-100 grams Fluid: 1.85 - 2.05 L/day  Skin: non-pitting LUE edema  Diet Order: Diet Heart  EDUCATION NEEDS: -No education needs identified at this time   Intake/Output Summary (Last 24 hours) at 03/18/14 0935 Last data filed at 03/18/14 0900  Gross per 24 hour  Intake    360 ml  Output    250 ml  Net    110 ml    Last BM: 1/18  Labs:   Recent Labs Lab 03/17/14 1525 03/18/14 0510  NA 137 136  K 4.4 4.6  CL 106 104  CO2 24 27  BUN 17 22  CREATININE 0.81 0.91  CALCIUM 9.0 8.9  GLUCOSE 168* 193*    CBG (last 3)  No results for input(s): GLUCAP in the last 72 hours.  Scheduled Meds: . diltiazem  180 mg Oral Daily  . docusate sodium  100 mg Oral BID  . metoprolol tartrate  25 mg Oral BID  . pantoprazole  40 mg Oral Daily  . psyllium  1 packet Oral Daily  . rosuvastatin  10 mg Oral q1800  . [START ON 03/22/2014] warfarin  5 mg Oral Once per day on Sun Tue  . warfarin  7.5 mg Oral Once per day on Mon Wed Thu Fri Sat  . Warfarin - Pharmacist Dosing Inpatient   Does not apply q1800    Continuous Infusions:   Past  Medical History  Diagnosis Date  . Hypertension   . High cholesterol   . Heart trouble   . Osteoarthritis   . Bone cancer   . Bilateral swelling of feet     Past Surgical History  Procedure Laterality Date  . Cholecystectomy      Kallie Locks, MS, RD, LDN Pager # [phone] After hours/ weekend pager # [phone]

## 2014-03-18 NOTE — Evaluation (Signed)
Physical Therapy Evaluation Patient Details Name: RED MANDT MRN: [id number] DOB: May 26, 1915 Today's Date: 03/18/2014   History of Present Illness  Pt is a 81 y.o. male whom fell at home. Pt sustained a Lt distal radius fx and imaging showed Lt hip fx. Ortho consult note states that pt can WBAT through Lt hip, if pain is manage they will not require surgery, if not, they may require surgery.   Clinical Impression  Pt adm due to above. PTA pt independent with mobility and ADLs. Pt has good family support from daughter but she is not able to provide incr physical (A) due to her own medical status. Pt presents with decreased independence functional mobility at this time secondary to deficits indicated below. Pt to benefit from skilled acute PT to address deficits and maximize mobility prior to D/C. Recommend SNF for post acute rehab prior to returning home with daughter due to incr (A) needed at this time for transfers and mobility.     Follow Up Recommendations SNF;Supervision/Assistance - 24 hour    Equipment Recommendations  Other (comment) (awaiting clarification on LT UE WB )    Recommendations for Other Services OT consult;Rehab consult     Precautions / Restrictions Precautions Precautions: Fall Required Braces or Orthoses: Sling Restrictions Weight Bearing Restrictions: Yes LLE Weight Bearing: Weight bearing as tolerated Other Position/Activity Restrictions: awaiting clarification on Lt UE; family stated NWB through wrist- clarifying if pt can WB trhough elbow       Mobility  Bed Mobility Overal bed mobility: Needs Assistance Bed Mobility: Sit to Supine       Sit to supine: Mod assist   General bed mobility comments: (A) to bring LEs into supine position and guard Lt UE; cues for NWB through Lt wrist  Transfers Overall transfer level: Needs assistance Equipment used: 1 person hand held assist (and support through gt belt) Transfers: Sit to/from Stand Sit to Stand:  +2 physical assistance;Mod assist         General transfer comment: cues for technique and upright posture; pt unsteady but was able to WB through Lt LE with standing  Ambulation/Gait Ambulation/Gait assistance: +2 physical assistance;Min assist Ambulation Distance (Feet): 5 Feet Assistive device: 1 person hand held assist (and support through gt belt) Gait Pattern/deviations: Step-to pattern;Decreased stride length;Shuffle;Antalgic;Decreased stance time - left;Decreased step length - right;Wide base of support;Trunk flexed Gait velocity: decreased Gait velocity interpretation: Below normal speed for age/gender General Gait Details: pt able to take short shuffled steps fwd and pivot to bed; 2 person (A) for safety and balance; may benefit from platform RW for incr balance once clarified with MD   Stairs            Wheelchair Mobility    Modified Rankin (Stroke Patients Only)       Balance Overall balance assessment: Needs assistance Sitting-balance support: Feet supported;No upper extremity supported Sitting balance-Leahy Scale: Fair Sitting balance - Comments: guarded   Standing balance support: During functional activity;Single extremity supported Standing balance-Leahy Scale: Poor Standing balance comment: 2 person (A)                              Pertinent Vitals/Pain Pain Assessment: 0-10 Pain Score: 7  Pain Location: pain in wrist and Lt knee; did not c/o hip pain  Pain Descriptors / Indicators: Aching;Crying;Sore Pain Intervention(s): Monitored during session;Premedicated before session;Repositioned    Home Living Family/patient expects to be discharged to:: Skilled  nursing facility Living Arrangements: Children Available Help at Discharge: Family;Available 24 hours/day Type of Home: House Home Access: Stairs to enter   [employer] of Steps: 1-2   Home Equipment: Cane - single point Additional Comments: Pt lives with daughter who  is disabled     Prior Function Level of Independence: Independent with assistive device(s)         Comments: pt ambulating with can PRN, independent with medications and ADLs      Hand Dominance   Dominant Hand: Right    Extremity/Trunk Assessment   Upper Extremity Assessment: Defer to OT evaluation           Lower Extremity Assessment: LLE deficits/detail   LLE Deficits / Details: quad 2+/5; hip 2+/5   Cervical / Trunk Assessment: Normal  Communication   Communication: HOH  Cognition Arousal/Alertness: Awake/alert Behavior During Therapy: WFL for tasks assessed/performed Overall Cognitive Status: Within Functional Limits for tasks assessed                      General Comments General comments (skin integrity, edema, etc.): discussed D/C recommendations and encouraged OOB activity with nursing     Exercises        Assessment/Plan    PT Assessment Patient needs continued PT services  PT Diagnosis Difficulty walking;Generalized weakness;Acute pain   PT Problem List Decreased strength;Decreased range of motion;Decreased activity tolerance;Decreased balance;Decreased mobility;Decreased safety awareness;Decreased knowledge of precautions;Decreased knowledge of use of DME;Pain  PT Treatment Interventions DME instruction;Gait training;Functional mobility training;Balance training;Therapeutic exercise;Therapeutic activities;Neuromuscular re-education;Patient/family education;Wheelchair mobility training   PT Goals (Current goals can be found in the Care Plan section) Acute Rehab PT Goals Patient Stated Goal: to get back to being independent  PT Goal Formulation: With patient Time For Goal Achievement: 03/25/14 Potential to Achieve Goals: Good    Frequency Min 3X/week   Barriers to discharge Decreased caregiver support daughter cannot provide level of support needed at this time     Co-evaluation               End of Session Equipment Utilized  During Treatment: Gait belt;Other (comment) (Lt sling ) Activity Tolerance: Patient limited by pain;Patient limited by fatigue Patient left: in bed;with call bell/phone within reach;with family/visitor present Nurse Communication: Mobility status;Precautions;Weight bearing status         Time: 9528-4132 PT Time Calculation (min) (ACUTE ONLY): 25 min   Charges:   PT Evaluation $Initial PT Evaluation Tier I: 1 Procedure PT Treatments $Gait Training: 8-22 mins   PT G CodesGustavus Bryant, [place] 03/18/2014, 3:10 PM

## 2014-03-18 NOTE — Progress Notes (Signed)
[facility] PROGRESS NOTE   [name] Chang YEB:[id number] DOB: 10-14-1915 DOA: 03/17/2014 PCP: No primary care provider on file.  HPI/Subjective: Seen with daughter at bedside, both patient and daughter trying to avoid surgery. Seen by Dr. Rolena Infante, recommended PT/OT, if if patient does not tolerate ambulation might need surgery.  Assessment/Plan: Active Problems:   Essential hypertension   Coronary atherosclerosis   Atrial fibrillation   Left displaced femoral neck fracture   Distal radius fracture, left   Hyperlipidemia   Closed left hip fracture   Left NON-displaced fracture of the femoral neck -Admitted after fall, with left radial fracture and left subcapital impacted femoral neck fracture. -CT scan showed impacted subcapital femoral neck fracture. -Seen by orthopedics, question of acute versus subacute fracture, PT/OT to evaluate and treat. -Patient does not have pain continue PT, if have pain might need surgery.  Left Fracture of Radius -splinted in the ED -may not require surgery  Atrial Fibrillation -currently rate controlled -continue with home medications -will monitor  CAD -stable at this time  Hypertension -monitor pressures -will continue with home medications  Thrombocytopenia -Appears to be chronic, monitor infrequently.  Chronic Anticoagulation -On Coumadin, currently on hold as not clear if patient will have surgery or not.  Code Status: Full Code Family Communication: Plan discussed with the patient. Disposition Plan: Remains inpatient Diet: Diet Heart  Consultants:  Dr. Rolena Infante of orthopedics  Procedures:  Casting of left forearm done in the ED.  Antibiotics:  None   Objective: Filed Vitals:   03/18/14 1009  BP: 120/75  Pulse: 83  Temp:   Resp:     Intake/Output Summary (Last 24 hours) at 03/18/14 1224 Last data filed at 03/18/14 0900  Gross per 24 hour  Intake    360 ml  Output    250 ml  Net    110 ml    There were no vitals filed for this visit.  Exam: General: Alert and awake, oriented x3, not in any acute distress. HEENT: anicteric sclera, pupils reactive to light and accommodation, EOMI CVS: S1-S2 clear, no murmur rubs or gallops Chest: clear to auscultation bilaterally, no wheezing, rales or rhonchi Abdomen: soft nontender, nondistended, normal bowel sounds, no organomegaly Extremities: no cyanosis, clubbing or edema noted bilaterally Neuro: Cranial nerves II-XII intact, no focal neurological deficits  Data Reviewed: Basic Metabolic Panel:  Recent Labs Lab 03/17/14 1525 03/18/14 0510  NA 137 136  K 4.4 4.6  CL 106 104  CO2 24 27  GLUCOSE 168* 193*  BUN 17 22  CREATININE 0.81 0.91  CALCIUM 9.0 8.9   Liver Function Tests: No results for input(s): AST, ALT, ALKPHOS, BILITOT, PROT, ALBUMIN in the last 168 hours. No results for input(s): LIPASE, AMYLASE in the last 168 hours. No results for input(s): AMMONIA in the last 168 hours. CBC:  Recent Labs Lab 03/17/14 1525 03/18/14 0510  WBC 8.1 11.8*  NEUTROABS 6.4  --   HGB 13.1 13.4  HCT 39.4 40.1  MCV 95.4 95.0  PLT 102* 72*   Cardiac Enzymes: No results for input(s): CKTOTAL, CKMB, CKMBINDEX, TROPONINI in the last 168 hours. BNP (last 3 results) No results for input(s): PROBNP in the last 8760 hours. CBG: No results for input(s): GLUCAP in the last 168 hours.  Micro No results found for this or any previous visit (from the past 240 hour(s)).   Studies: Dg Chest 2 View  03/17/2014   CLINICAL DATA:  [place] today with chest pain  EXAM: CHEST  2 VIEW  COMPARISON:  08/23/2009  FINDINGS: Cardiac shadow remains enlarged. Postsurgical changes are again noted. Mild interstitial changes are seen without focal infiltrate or sizable effusion. No acute bony abnormality is noted.  IMPRESSION: No acute abnormality seen.   Electronically Signed   By: Inez Catalina M.D.   On: 03/17/2014 17:11   Dg Forearm Left  03/17/2014    CLINICAL DATA:  Wrist and distal forearm pain after falling.  EXAM: LEFT FOREARM - 2 VIEW  COMPARISON:  None.  FINDINGS: The bones are demineralized. There is a mildly impacted intra-articular fracture of the distal radius. There is a nondisplaced ulnar styloid fracture. No proximal injuries are identified. The alignment appears normal at the elbow. There is scapholunate diastasis. Scattered vascular calcifications are noted.  IMPRESSION: Impacted intra-articular fracture of the distal radius with nondisplaced ulnar styloid fracture.   Electronically Signed   By: Camie Patience M.D.   On: 03/17/2014 17:12   Dg Wrist Complete Left  03/17/2014   CLINICAL DATA:  Status post fall today with severe left wrist pain. Initial encounter.  EXAM: LEFT WRIST - COMPLETE 3+ VIEW  COMPARISON:  None.  FINDINGS: The patient has an acute and mildly impacted distal radius fracture with intra-articular extension. No other fracture is identified. There is marked soft tissue swelling about the wrist. First CMC and scaphoid trapezium trapezoid joint osteoarthritis is noted.  IMPRESSION: Mildly impacted distal radius fracture with intra-articular involvement and associated soft tissue swelling.   Electronically Signed   By: Inge Rise M.D.   On: 03/17/2014 17:12   Dg Ankle Complete Left  03/17/2014   CLINICAL DATA:  Left ankle pain following fall, initial encounter  EXAM: LEFT ANKLE COMPLETE - 3+ VIEW  COMPARISON:  None.  FINDINGS: Mild soft tissue swelling is noted laterally. No acute fracture or dislocation is seen. Diffuse vascular calcifications are noted.  IMPRESSION: Soft tissue swelling without acute bony abnormality.   Electronically Signed   By: Inez Catalina M.D.   On: 03/17/2014 17:18   Ct Head Wo Contrast  03/17/2014   CLINICAL DATA:  Status post fall with a blow to the head.  EXAM: CT HEAD WITHOUT CONTRAST  CT CERVICAL SPINE WITHOUT CONTRAST  TECHNIQUE: Multidetector CT imaging of the head and cervical spine was  performed following the standard protocol without intravenous contrast. Multiplanar CT image reconstructions of the cervical spine were also generated.  COMPARISON:  Chest CT scan 08/03/2006.  FINDINGS: CT HEAD FINDINGS  Contusion is seen over the left frontal bone without underlying fracture. The brain is atrophic with chronic microvascular ischemic change. No evidence of acute intracranial abnormality including infarct, hemorrhage, mass lesion, mass effect, midline shift or abnormal extra-axial fluid collection. No hydrocephalus or pneumocephalus. Imaged paranasal sinuses and mastoid air cells are clear.  CT CERVICAL SPINE FINDINGS  There is no fracture or malalignment of the cervical spine. Mild cervical spondylosis predominantly involves the facet joints. Lung apices demonstrate some scarring on the left, unchanged, and scattered ground-glass attenuation.  IMPRESSION: Hematoma over the left frontal bone without underlying fracture or acute intracranial abnormality.  No acute abnormality cervical spine.   Electronically Signed   By: Inge Rise M.D.   On: 03/17/2014 16:10   Ct Cervical Spine Wo Contrast  03/17/2014   CLINICAL DATA:  Status post fall with a blow to the head.  EXAM: CT HEAD WITHOUT CONTRAST  CT CERVICAL SPINE WITHOUT CONTRAST  TECHNIQUE: Multidetector CT imaging of the head and cervical spine was  performed following the standard protocol without intravenous contrast. Multiplanar CT image reconstructions of the cervical spine were also generated.  COMPARISON:  Chest CT scan 08/03/2006.  FINDINGS: CT HEAD FINDINGS  Contusion is seen over the left frontal bone without underlying fracture. The brain is atrophic with chronic microvascular ischemic change. No evidence of acute intracranial abnormality including infarct, hemorrhage, mass lesion, mass effect, midline shift or abnormal extra-axial fluid collection. No hydrocephalus or pneumocephalus. Imaged paranasal sinuses and mastoid air cells  are clear.  CT CERVICAL SPINE FINDINGS  There is no fracture or malalignment of the cervical spine. Mild cervical spondylosis predominantly involves the facet joints. Lung apices demonstrate some scarring on the left, unchanged, and scattered ground-glass attenuation.  IMPRESSION: Hematoma over the left frontal bone without underlying fracture or acute intracranial abnormality.  No acute abnormality cervical spine.   Electronically Signed   By: Inge Rise M.D.   On: 03/17/2014 16:10   Ct Hip Left Wo Contrast  03/17/2014   CLINICAL DATA:  Patient fell today. Questionable left hip fracture on radiographs.  EXAM: CT OF THE LEFT HIP WITHOUT CONTRAST  TECHNIQUE: Multidetector CT imaging of the left hip was performed according to the standard protocol. Multiplanar CT image reconstructions were also generated.  COMPARISON:  Current left hip radiographs  FINDINGS: Subcapital fracture of the left femoral neck. 3 shows slight impaction and apex anterior angulation. No significant comminution.  No other fractures.  Left hip joint normally aligned.  Mild subcutaneous edema lateral to the greater trochanter.  No soft tissue hematoma.  There are dense vascular calcifications.  Bones are diffusely demineralized.  IMPRESSION: 1. Subcapital left femoral neck fracture without significant displacement. There is slight impaction and apex anterior angulation. No comminution.   Electronically Signed   By: Lajean Manes M.D.   On: 03/17/2014 21:10   Chest Portable 1 View  03/18/2014   CLINICAL DATA:  Recent fall with multiple wound fractures  EXAM: PORTABLE CHEST - 1 VIEW  COMPARISON:  03/17/14  FINDINGS: Cardiomegaly is again noted. Tortuosity of the thoracic aorta with calcification is seen. Increased vascular congestion is noted as well as some bibasilar atelectatic changes. No sizable effusion is noted. No acute bony abnormality is seen.  IMPRESSION: Increasing vascular congestion with bibasilar atelectatic changes.    Electronically Signed   By: Inez Catalina M.D.   On: 03/18/2014 08:07   Dg Knee Complete 4 Views Left  03/17/2014   CLINICAL DATA:  Fall, severe pain  EXAM: LEFT KNEE - COMPLETE 4+ VIEW  COMPARISON:  None.  FINDINGS: Four views of the left knee submitted. No acute fracture or subluxation. There is diffuse osteopenia. Significant narrowing of medial joint compartment. Mild chondrocalcinosis. Mild spurring of medial femoral condyle and medial tibial plateau. Narrowing of patellofemoral joint space. No joint effusion. Atherosclerotic calcifications of femoral and popliteal artery.  IMPRESSION: No acute fracture or subluxation. Diffuse osteopenia. Osteoarthritic changes as described above. Mild chondrocalcinosis.   Electronically Signed   By: Lahoma Crocker M.D.   On: 03/17/2014 17:13   Dg Humerus Left  03/17/2014   CLINICAL DATA:  Recent fall with left arm pain  EXAM: LEFT HUMERUS - 2+ VIEW  COMPARISON:  None.  FINDINGS: The humerus is well visualized and within normal limits. Degenerative changes of the acromioclavicular joint are seen. No gross soft tissue abnormality is noted.  IMPRESSION: No acute abnormality seen.   Electronically Signed   By: Inez Catalina M.D.   On: 03/17/2014 17:12  Dg Hand Complete Right  03/17/2014   CLINICAL DATA:  Pain post fall  EXAM: RIGHT HAND - COMPLETE 3+ VIEW  COMPARISON:  None.  FINDINGS: Three views of the right hand submitted. No acute fracture or subluxation. There is narrowing of radiocarpal joint space. Diffuse osteopenia. Mild chondrocalcinosis. Degenerative changes first carpometacarpal joint. Degenerative changes anterior aspect of scaphoid. Degenerative changes proximal and distal interphalangeal joints.  IMPRESSION: No acute fracture or subluxation. Diffuse osteopenia. Degenerative changes as described above.   Electronically Signed   By: Lahoma Crocker M.D.   On: 03/17/2014 17:15   Dg Hip Unilat With Pelvis 2-3 Views Left  03/17/2014   CLINICAL DATA:  Status post fall.  Left hip pain with limited range of motion. Initial encounter.  EXAM: DG HIP W/ PELVIS 2-3V*L*  COMPARISON:  None.  FINDINGS: The bones are demineralized. There is deformity of the left femoral neck suspicious for a fracture. This is not definitive and could be related to an old injury. There is no evidence of pelvic fracture or dislocation. Mild degenerative changes are present at the hips and sacroiliac joints. There are diffuse vascular calcifications. Postsurgical changes are noted related to left inguinal hernia repair.  IMPRESSION: Left femoral neck deformity suspicious for a nondisplaced fracture, age indeterminate. CT suggested for further evaluation.   Electronically Signed   By: Camie Patience M.D.   On: 03/17/2014 17:15    Scheduled Meds: . diltiazem  180 mg Oral Daily  . docusate sodium  100 mg Oral BID  . metoprolol tartrate  25 mg Oral BID  . pantoprazole  40 mg Oral Daily  . psyllium  1 packet Oral Daily  . rosuvastatin  10 mg Oral q1800  . [START ON 03/22/2014] warfarin  5 mg Oral Once per day on Sun Tue  . warfarin  7.5 mg Oral Once per day on Mon Wed Thu Fri Sat  . Warfarin - Pharmacist Dosing Inpatient   Does not apply q1800   Continuous Infusions:      Time spent: 35 minutes    [facility]  [facility] Pager [phone] If 7PM-7AM, please contact night-coverage at [email or web address], password [facility] 03/18/2014, 12:24 PM  LOS: 1 day

## 2014-03-18 NOTE — Progress Notes (Signed)
    Subjective:     Patient reports pain as 3 on 0-10 scale. - localized to left knee and wrist  Denies CP or SOB.  Voiding without difficulty. Positive flatus. Objective: Vital signs in last 24 hours: Temp:  [97.4 F (36.3 C)-98.4 F (36.9 C)] 97.7 F (36.5 C) (01/20 0600) Pulse Rate:  [38-136] 83 (01/20 1009) Resp:  [11-26] 20 (01/20 0600) BP: (82-151)/(45-114) 120/75 mmHg (01/20 1009) SpO2:  [83 %-95 %] 95 % (01/20 0600)  Intake/Output from previous day: 01/19 0701 - 01/20 0700 In: 240 [P.O.:240] Out: 250 [Urine:250] Intake/Output this shift: Total I/O In: 120 [P.O.:120] Out: -   Labs:  Recent Labs  03/17/14 1525 03/18/14 0510  HGB 13.1 13.4    Recent Labs  03/17/14 1525 03/18/14 0510  WBC 8.1 11.8*  RBC 4.13* 4.22  HCT 39.4 40.1  PLT 102* 72*    Recent Labs  03/17/14 1525 03/18/14 0510  NA 137 136  K 4.4 4.6  CL 106 104  CO2 24 27  BUN 17 22  CREATININE 0.81 0.91  GLUCOSE 168* 193*  CALCIUM 9.0 8.9    Recent Labs  03/17/14 2252 03/18/14 0510  INR 2.45* 2.38*    Physical Exam: Neurologically intact ABD soft Neurovascular intact Compartment soft no left hip/groin pain with ROM  Assessment/Plan:   Patient with question of subacute impacted femoral neck fracture No pain with ROM and palpation Patient and family have strong desire to avoid surgery Will get up and ambulate with PT - if no pain then hold off on surgery   Lian Pounds D for Dr. Melina Schools [facility] [phone] 03/18/2014, 11:37 AM

## 2014-03-18 NOTE — Progress Notes (Signed)
Utilization review completed. Deloros Beretta, RN, BSN. 

## 2014-03-18 NOTE — Progress Notes (Signed)
ANTICOAGULATION CONSULT NOTE   Pharmacy Consult for Coumadin Indication: atrial fibrillation  No Known Allergies  Vital Signs: Temp: 97.7 F (36.5 C) (01/20 0600) BP: 120/75 mmHg (01/20 1009) Pulse Rate: 83 (01/20 1009)  Labs:  Recent Labs  03/17/14 1525 03/17/14 2252 03/18/14 0510  HGB 13.1  --  13.4  HCT 39.4  --  40.1  PLT 102*  --  72*  APTT  --  48*  --   LABPROT 25.8* 26.8* 26.2*  INR 2.33* 2.45* 2.38*  CREATININE 0.81  --  0.91    CrCl cannot be calculated (Unknown ideal weight.).  Assessment: 81 yo male with hx of afib will be continued on Coumadin. INR therapeutic   Goal of Therapy:  INR 2-3 Monitor platelets by anticoagulation protocol: Yes   Plan:  - Continue home coumadin dosing of 7.5 mg daily except 5 mg on Tuesdays and Sundays starting tomorrow - Daily PT/INR for now - f/u plan if surgery is need or not  Thank you. Anette Guarneri, PharmD [phone]  03/18/2014,10:20 AM

## 2014-03-19 DIAGNOSIS — S52502A Unspecified fracture of the lower end of left radius, initial encounter for closed fracture: Secondary | ICD-10-CM

## 2014-03-19 DIAGNOSIS — I482 Chronic atrial fibrillation: Secondary | ICD-10-CM

## 2014-03-19 LAB — BASIC METABOLIC PANEL
Anion gap: 9 (ref 5–15)
BUN: 20 mg/dL (ref 6–23)
CHLORIDE: 101 meq/L (ref 96–112)
CO2: 24 mmol/L (ref 19–32)
Calcium: 8.7 mg/dL (ref 8.4–10.5)
Creatinine, Ser: 0.8 mg/dL (ref 0.50–1.35)
GFR calc Af Amer: 85 mL/min — ABNORMAL LOW (ref >90)
GFR, EST NON AFRICAN AMERICAN: 73 mL/min — AB (ref >90)
Glucose, Bld: 154 mg/dL — ABNORMAL HIGH (ref 70–99)
Potassium: 4.1 mmol/L (ref 3.5–5.1)
Sodium: 134 mmol/L — ABNORMAL LOW (ref 135–145)

## 2014-03-19 LAB — CBC
HCT: 39.6 % (ref 39.0–52.0)
Hemoglobin: 13.1 g/dL (ref 13.0–17.0)
MCH: 31.6 pg (ref 26.0–34.0)
MCHC: 33.1 g/dL (ref 30.0–36.0)
MCV: 95.4 fL (ref 78.0–100.0)
Platelets: 63 10*3/uL — ABNORMAL LOW (ref 150–400)
RBC: 4.15 MIL/uL — AB (ref 4.22–5.81)
RDW: 14.5 % (ref 11.5–15.5)
WBC: 10.1 10*3/uL (ref 4.0–10.5)

## 2014-03-19 LAB — PROTIME-INR
INR: 1.99 — ABNORMAL HIGH (ref 0.00–1.49)
Prothrombin Time: 22.8 seconds — ABNORMAL HIGH (ref 11.6–15.2)

## 2014-03-19 MED ORDER — ASPIRIN 81 MG PO CHEW
81.0000 mg | CHEWABLE_TABLET | Freq: Every day | ORAL | Status: DC
  Administered 2014-03-19 – 2014-03-23 (×3): 81 mg via ORAL
  Filled 2014-03-19 (×4): qty 1

## 2014-03-19 MED ORDER — WHITE PETROLATUM GEL
Status: AC
  Administered 2014-03-19: 21:00:00
  Filled 2014-03-19: qty 1

## 2014-03-19 NOTE — Progress Notes (Signed)
    Subjective:     Patient reports pain as 4 on 0-10 scale.   Denies CP or SOB.  Voiding without difficulty. Positive flatus. Objective: Vital signs in last 24 hours: Temp:  [98 F (36.7 C)-98.6 F (37 C)] 98 F (36.7 C) (01/21 0600) Pulse Rate:  [83-99] 85 (01/21 0600) Resp:  [18-20] 18 (01/21 0600) BP: (120-139)/(62-83) 120/62 mmHg (01/21 0600) SpO2:  [93 %-100 %] 93 % (01/21 0600)  Intake/Output from previous day: 01/20 0701 - 01/21 0700 In: 840 [P.O.:840] Out: 500 [Urine:500] Intake/Output this shift:    Labs:  Recent Labs  03/17/14 1525 03/18/14 0510 03/19/14 0643  HGB 13.1 13.4 13.1    Recent Labs  03/18/14 0510 03/19/14 0643  WBC 11.8* 10.1  RBC 4.22 4.15*  HCT 40.1 39.6  PLT 72* 63*    Recent Labs  03/18/14 0510 03/19/14 0643  NA 136 134*  K 4.6 4.1  CL 104 101  CO2 27 24  BUN 22 20  CREATININE 0.91 0.80  GLUCOSE 193* 154*  CALCIUM 8.9 8.7    Recent Labs  03/18/14 0510 03/19/14 0643  INR 2.38* 1.99*    Physical Exam: Neurologically intact ABD soft Compartment soft hip pain worse this AM  Assessment/Plan: Spoke with Dr Alvan Dame Will plan on hemi-arthrioplasty in AM  Patient and family are in agreement with surgical plan Patients pain and function are worsening.    Spoke with medical team  Also speak with anesthesia about epidural/spinal   Laporchia Nakajima D for Dr. Melina Schools [facility] [phone] 03/19/2014, 9:49 AM

## 2014-03-19 NOTE — Progress Notes (Signed)
Orthopedic Tech Progress Note Patient Details:  [name] Chang [birth year] [id number] Finger splint applied over dressing placed by MD Ortho Devices Type of Ortho Device: Finger splint Ortho Device/Splint Location: Right 3rd DIP Ortho Device/Splint Interventions: Application   Asia R Thompson 03/19/2014, 10:43 AM

## 2014-03-19 NOTE — Progress Notes (Signed)
Patient ID: [name] Chang, male   DOB: Jul 03, 1915, 81 y.o.   MRN: [id number]  03/19/14 Hand Surgery  Chart reviewed, tentative plans to proceed to OR tomorrow for Hemi-arthroplasty per Dr. Alvan Dame. In regards to left distal radius fracture, we will plan for conservative care of immobilization and serial radiographs. Non-weight bearing to the left wrist and hand at this time.  We will have patient to follow up in our office setting in 1 week for repeat radiographs of left wrist and right middle finger. We will be available for any concerns or issues that may arise while inpatient.  Aaron Edelman L. Jenean Lindau PA-C

## 2014-03-19 NOTE — Progress Notes (Addendum)
[facility] PROGRESS NOTE   COBE VINEY MCN:[id number] DOB: 1915/03/13 DOA: 03/17/2014 PCP: No primary care provider on file.  HPI/Subjective: Seen with daughter at bedside, ambulated yesterday with PT/OT. Discussed with Dr. Rolena Infante, patient for surgery in a.m.  Assessment/Plan: Active Problems:   Essential hypertension   Coronary atherosclerosis   Atrial fibrillation   Left displaced femoral neck fracture   Distal radius fracture, left   Hyperlipidemia   Closed left hip fracture   Left NON-displaced fracture of the femoral neck -Admitted after fall, with left radial fracture and left subcapital impacted femoral neck fracture. -CT scan showed impacted subcapital femoral neck fracture. -Seen by orthopedics, initially recommended ambulation if he tolerates that no surgery. -Was not able to ambulate with PT, patient for surgery in a.m. -Control pain with a convex, DVT prophylaxis with SCD, was on Coumadin INR is 1.99.  Left Fracture of Radius -splinted in the ED -Control pain with narcotics  Atrial Fibrillation -currently rate controlled -continue with home medications -will monitor  CAD -stable at this time  Hypertension -monitor pressures -will continue with home medications  Thrombocytopenia -Appears to be chronic, monitor infrequently.  Chronic Anticoagulation -On Coumadin, Coumadin on hold. -Patient demonstrated high risk for falls along with his advanced age I think he should be better off of Coumadin. -We'll start low-dose aspirin for his atrial fibrillation.  Code Status: Full Code Family Communication: Plan discussed with the patient. Disposition Plan: Remains inpatient Diet: Diet Heart  Consultants:  Dr. Rolena Infante of orthopedics  Procedures:  Casting of left forearm done in the ED.  Antibiotics:  None   Objective: Filed Vitals:   03/19/14 0600  BP: 120/62  Pulse: 85  Temp: 98 F (36.7 C)  Resp: 18    Intake/Output Summary  (Last 24 hours) at 03/19/14 0934 Last data filed at 03/19/14 0700  Gross per 24 hour  Intake    720 ml  Output    500 ml  Net    220 ml   There were no vitals filed for this visit.  Exam: General: Alert and awake, oriented x3, not in any acute distress. HEENT: anicteric sclera, pupils reactive to light and accommodation, EOMI CVS: S1-S2 clear, no murmur rubs or gallops Chest: clear to auscultation bilaterally, no wheezing, rales or rhonchi Abdomen: soft nontender, nondistended, normal bowel sounds, no organomegaly Extremities: no cyanosis, clubbing or edema noted bilaterally Neuro: Cranial nerves II-XII intact, no focal neurological deficits  Data Reviewed: Basic Metabolic Panel:  Recent Labs Lab 03/17/14 1525 03/18/14 0510 03/19/14 0643  NA 137 136 134*  K 4.4 4.6 4.1  CL 106 104 101  CO2 24 27 24   GLUCOSE 168* 193* 154*  BUN 17 22 20   CREATININE 0.81 0.91 0.80  CALCIUM 9.0 8.9 8.7   Liver Function Tests: No results for input(s): AST, ALT, ALKPHOS, BILITOT, PROT, ALBUMIN in the last 168 hours. No results for input(s): LIPASE, AMYLASE in the last 168 hours. No results for input(s): AMMONIA in the last 168 hours. CBC:  Recent Labs Lab 03/17/14 1525 03/18/14 0510 03/19/14 0643  WBC 8.1 11.8* 10.1  NEUTROABS 6.4  --   --   HGB 13.1 13.4 13.1  HCT 39.4 40.1 39.6  MCV 95.4 95.0 95.4  PLT 102* 72* 63*   Cardiac Enzymes: No results for input(s): CKTOTAL, CKMB, CKMBINDEX, TROPONINI in the last 168 hours. BNP (last 3 results) No results for input(s): PROBNP in the last 8760 hours. CBG: No results for input(s): GLUCAP in the  last 168 hours.  Micro No results found for this or any previous visit (from the past 240 hour(s)).   Studies: Dg Chest 2 View  03/17/2014   CLINICAL DATA:  [place] today with chest pain  EXAM: CHEST  2 VIEW  COMPARISON:  08/23/2009  FINDINGS: Cardiac shadow remains enlarged. Postsurgical changes are again noted. Mild interstitial changes are  seen without focal infiltrate or sizable effusion. No acute bony abnormality is noted.  IMPRESSION: No acute abnormality seen.   Electronically Signed   By: Inez Catalina M.D.   On: 03/17/2014 17:11   Dg Forearm Left  03/17/2014   CLINICAL DATA:  Wrist and distal forearm pain after falling.  EXAM: LEFT FOREARM - 2 VIEW  COMPARISON:  None.  FINDINGS: The bones are demineralized. There is a mildly impacted intra-articular fracture of the distal radius. There is a nondisplaced ulnar styloid fracture. No proximal injuries are identified. The alignment appears normal at the elbow. There is scapholunate diastasis. Scattered vascular calcifications are noted.  IMPRESSION: Impacted intra-articular fracture of the distal radius with nondisplaced ulnar styloid fracture.   Electronically Signed   By: Camie Patience M.D.   On: 03/17/2014 17:12   Dg Wrist Complete Left  03/17/2014   CLINICAL DATA:  Status post fall today with severe left wrist pain. Initial encounter.  EXAM: LEFT WRIST - COMPLETE 3+ VIEW  COMPARISON:  None.  FINDINGS: The patient has an acute and mildly impacted distal radius fracture with intra-articular extension. No other fracture is identified. There is marked soft tissue swelling about the wrist. First CMC and scaphoid trapezium trapezoid joint osteoarthritis is noted.  IMPRESSION: Mildly impacted distal radius fracture with intra-articular involvement and associated soft tissue swelling.   Electronically Signed   By: Inge Rise M.D.   On: 03/17/2014 17:12   Dg Ankle Complete Left  03/17/2014   CLINICAL DATA:  Left ankle pain following fall, initial encounter  EXAM: LEFT ANKLE COMPLETE - 3+ VIEW  COMPARISON:  None.  FINDINGS: Mild soft tissue swelling is noted laterally. No acute fracture or dislocation is seen. Diffuse vascular calcifications are noted.  IMPRESSION: Soft tissue swelling without acute bony abnormality.   Electronically Signed   By: Inez Catalina M.D.   On: 03/17/2014 17:18   Ct  Head Wo Contrast  03/17/2014   CLINICAL DATA:  Status post fall with a blow to the head.  EXAM: CT HEAD WITHOUT CONTRAST  CT CERVICAL SPINE WITHOUT CONTRAST  TECHNIQUE: Multidetector CT imaging of the head and cervical spine was performed following the standard protocol without intravenous contrast. Multiplanar CT image reconstructions of the cervical spine were also generated.  COMPARISON:  Chest CT scan 08/03/2006.  FINDINGS: CT HEAD FINDINGS  Contusion is seen over the left frontal bone without underlying fracture. The brain is atrophic with chronic microvascular ischemic change. No evidence of acute intracranial abnormality including infarct, hemorrhage, mass lesion, mass effect, midline shift or abnormal extra-axial fluid collection. No hydrocephalus or pneumocephalus. Imaged paranasal sinuses and mastoid air cells are clear.  CT CERVICAL SPINE FINDINGS  There is no fracture or malalignment of the cervical spine. Mild cervical spondylosis predominantly involves the facet joints. Lung apices demonstrate some scarring on the left, unchanged, and scattered ground-glass attenuation.  IMPRESSION: Hematoma over the left frontal bone without underlying fracture or acute intracranial abnormality.  No acute abnormality cervical spine.   Electronically Signed   By: Inge Rise M.D.   On: 03/17/2014 16:10   Ct Cervical Spine  Wo Contrast  03/17/2014   CLINICAL DATA:  Status post fall with a blow to the head.  EXAM: CT HEAD WITHOUT CONTRAST  CT CERVICAL SPINE WITHOUT CONTRAST  TECHNIQUE: Multidetector CT imaging of the head and cervical spine was performed following the standard protocol without intravenous contrast. Multiplanar CT image reconstructions of the cervical spine were also generated.  COMPARISON:  Chest CT scan 08/03/2006.  FINDINGS: CT HEAD FINDINGS  Contusion is seen over the left frontal bone without underlying fracture. The brain is atrophic with chronic microvascular ischemic change. No evidence of  acute intracranial abnormality including infarct, hemorrhage, mass lesion, mass effect, midline shift or abnormal extra-axial fluid collection. No hydrocephalus or pneumocephalus. Imaged paranasal sinuses and mastoid air cells are clear.  CT CERVICAL SPINE FINDINGS  There is no fracture or malalignment of the cervical spine. Mild cervical spondylosis predominantly involves the facet joints. Lung apices demonstrate some scarring on the left, unchanged, and scattered ground-glass attenuation.  IMPRESSION: Hematoma over the left frontal bone without underlying fracture or acute intracranial abnormality.  No acute abnormality cervical spine.   Electronically Signed   By: Inge Rise M.D.   On: 03/17/2014 16:10   Ct Hip Left Wo Contrast  03/17/2014   CLINICAL DATA:  Patient fell today. Questionable left hip fracture on radiographs.  EXAM: CT OF THE LEFT HIP WITHOUT CONTRAST  TECHNIQUE: Multidetector CT imaging of the left hip was performed according to the standard protocol. Multiplanar CT image reconstructions were also generated.  COMPARISON:  Current left hip radiographs  FINDINGS: Subcapital fracture of the left femoral neck. 3 shows slight impaction and apex anterior angulation. No significant comminution.  No other fractures.  Left hip joint normally aligned.  Mild subcutaneous edema lateral to the greater trochanter.  No soft tissue hematoma.  There are dense vascular calcifications.  Bones are diffusely demineralized.  IMPRESSION: 1. Subcapital left femoral neck fracture without significant displacement. There is slight impaction and apex anterior angulation. No comminution.   Electronically Signed   By: Lajean Manes M.D.   On: 03/17/2014 21:10   Chest Portable 1 View  03/18/2014   CLINICAL DATA:  Recent fall with multiple wound fractures  EXAM: PORTABLE CHEST - 1 VIEW  COMPARISON:  03/17/14  FINDINGS: Cardiomegaly is again noted. Tortuosity of the thoracic aorta with calcification is seen. Increased  vascular congestion is noted as well as some bibasilar atelectatic changes. No sizable effusion is noted. No acute bony abnormality is seen.  IMPRESSION: Increasing vascular congestion with bibasilar atelectatic changes.   Electronically Signed   By: Inez Catalina M.D.   On: 03/18/2014 08:07   Dg Knee Complete 4 Views Left  03/17/2014   CLINICAL DATA:  Fall, severe pain  EXAM: LEFT KNEE - COMPLETE 4+ VIEW  COMPARISON:  None.  FINDINGS: Four views of the left knee submitted. No acute fracture or subluxation. There is diffuse osteopenia. Significant narrowing of medial joint compartment. Mild chondrocalcinosis. Mild spurring of medial femoral condyle and medial tibial plateau. Narrowing of patellofemoral joint space. No joint effusion. Atherosclerotic calcifications of femoral and popliteal artery.  IMPRESSION: No acute fracture or subluxation. Diffuse osteopenia. Osteoarthritic changes as described above. Mild chondrocalcinosis.   Electronically Signed   By: Lahoma Crocker M.D.   On: 03/17/2014 17:13   Dg Humerus Left  03/17/2014   CLINICAL DATA:  Recent fall with left arm pain  EXAM: LEFT HUMERUS - 2+ VIEW  COMPARISON:  None.  FINDINGS: The humerus is well visualized and  within normal limits. Degenerative changes of the acromioclavicular joint are seen. No gross soft tissue abnormality is noted.  IMPRESSION: No acute abnormality seen.   Electronically Signed   By: Inez Catalina M.D.   On: 03/17/2014 17:12   Dg Hand Complete Right  03/17/2014   CLINICAL DATA:  Pain post fall  EXAM: RIGHT HAND - COMPLETE 3+ VIEW  COMPARISON:  None.  FINDINGS: Three views of the right hand submitted. No acute fracture or subluxation. There is narrowing of radiocarpal joint space. Diffuse osteopenia. Mild chondrocalcinosis. Degenerative changes first carpometacarpal joint. Degenerative changes anterior aspect of scaphoid. Degenerative changes proximal and distal interphalangeal joints.  IMPRESSION: No acute fracture or subluxation.  Diffuse osteopenia. Degenerative changes as described above.   Electronically Signed   By: Lahoma Crocker M.D.   On: 03/17/2014 17:15   Dg Hip Unilat With Pelvis 2-3 Views Left  03/17/2014   CLINICAL DATA:  Status post fall. Left hip pain with limited range of motion. Initial encounter.  EXAM: DG HIP W/ PELVIS 2-3V*L*  COMPARISON:  None.  FINDINGS: The bones are demineralized. There is deformity of the left femoral neck suspicious for a fracture. This is not definitive and could be related to an old injury. There is no evidence of pelvic fracture or dislocation. Mild degenerative changes are present at the hips and sacroiliac joints. There are diffuse vascular calcifications. Postsurgical changes are noted related to left inguinal hernia repair.  IMPRESSION: Left femoral neck deformity suspicious for a nondisplaced fracture, age indeterminate. CT suggested for further evaluation.   Electronically Signed   By: Camie Patience M.D.   On: 03/17/2014 17:15    Scheduled Meds: . diltiazem  180 mg Oral Daily  . docusate sodium  100 mg Oral BID  . feeding supplement (ENSURE COMPLETE)  237 mL Oral BID BM  . metoprolol tartrate  25 mg Oral BID  . pantoprazole  40 mg Oral Daily  . psyllium  1 packet Oral Daily  . rosuvastatin  10 mg Oral q1800   Continuous Infusions:      Time spent: 35 minutes    [facility]  [facility] Pager [phone] If 7PM-7AM, please contact night-coverage at [email or web address], password [facility] 03/19/2014, 9:34 AM  LOS: 2 days

## 2014-03-19 NOTE — Progress Notes (Signed)
03/19/14 PT recommended SNF, referral made to [facility]. Will continue to follow.

## 2014-03-20 ENCOUNTER — Inpatient Hospital Stay (HOSPITAL_COMMUNITY): Payer: Medicare Other | Admitting: Anesthesiology

## 2014-03-20 ENCOUNTER — Encounter (HOSPITAL_COMMUNITY)
Admission: EM | Disposition: A | Discharge status: Skilled Nursing Facility | Payer: Self-pay | Source: Home / Self Care | Attending: Internal Medicine

## 2014-03-20 ENCOUNTER — Inpatient Hospital Stay (HOSPITAL_COMMUNITY): Payer: Medicare Other

## 2014-03-20 HISTORY — PX: HIP ARTHROPLASTY: SHX981

## 2014-03-20 LAB — MRSA PCR SCREENING: MRSA BY PCR: NEGATIVE

## 2014-03-20 LAB — PROTIME-INR
INR: 1.62 — ABNORMAL HIGH (ref 0.00–1.49)
Prothrombin Time: 19.4 seconds — ABNORMAL HIGH (ref 11.6–15.2)

## 2014-03-20 SURGERY — HEMIARTHROPLASTY, HIP, DIRECT ANTERIOR APPROACH, FOR FRACTURE
Anesthesia: General | Laterality: Left

## 2014-03-20 MED ORDER — LIDOCAINE HCL (CARDIAC) 20 MG/ML IV SOLN
INTRAVENOUS | Status: DC | PRN
  Administered 2014-03-20: 40 mg via INTRAVENOUS

## 2014-03-20 MED ORDER — OMEGA 3 1200 MG PO CAPS: 1200.0000 mg | ORAL_CAPSULE | Freq: Every day | ORAL | Status: DC

## 2014-03-20 MED ORDER — METOCLOPRAMIDE HCL 5 MG/ML IJ SOLN: 5.0000 mg | Freq: Three times a day (TID) | INTRAMUSCULAR | Status: DC | PRN

## 2014-03-20 MED ORDER — ALUM & MAG HYDROXIDE-SIMETH 200-200-20 MG/5ML PO SUSP
30.0000 mL | ORAL | Status: DC | PRN
Start: 2014-03-20 — End: 2014-03-23
  Administered 2014-03-21: 30 mL via ORAL
  Filled 2014-03-20: qty 30

## 2014-03-20 MED ORDER — PROPOFOL 10 MG/ML IV BOLUS
INTRAVENOUS | Status: AC
  Filled 2014-03-20: qty 20

## 2014-03-20 MED ORDER — PROMETHAZINE HCL 25 MG/ML IJ SOLN: 6.2500 mg | INTRAMUSCULAR | Status: DC | PRN

## 2014-03-20 MED ORDER — PHENOL 1.4 % MT LIQD: 1.0000 | OROMUCOSAL | Status: DC | PRN

## 2014-03-20 MED ORDER — MORPHINE SULFATE 2 MG/ML IJ SOLN: 0.5000 mg | INTRAMUSCULAR | Status: DC | PRN

## 2014-03-20 MED ORDER — FERROUS SULFATE 325 (65 FE) MG PO TABS
325.0000 mg | ORAL_TABLET | Freq: Two times a day (BID) | ORAL | Status: DC
  Administered 2014-03-21 – 2014-03-23 (×4): 325 mg via ORAL
  Filled 2014-03-20 (×7): qty 1

## 2014-03-20 MED ORDER — NEOSTIGMINE METHYLSULFATE 10 MG/10ML IV SOLN
INTRAVENOUS | Status: DC | PRN
  Administered 2014-03-20: 3 mg via INTRAVENOUS

## 2014-03-20 MED ORDER — SODIUM CHLORIDE 0.9 % IV SOLN
INTRAVENOUS | Status: DC
  Filled 2014-03-20 (×4): qty 1000

## 2014-03-20 MED ORDER — ATORVASTATIN CALCIUM 10 MG PO TABS
10.0000 mg | ORAL_TABLET | Freq: Every day | ORAL | Status: DC
  Administered 2014-03-20 – 2014-03-23 (×4): 10 mg via ORAL
  Filled 2014-03-20 (×4): qty 1

## 2014-03-20 MED ORDER — ROCURONIUM BROMIDE 100 MG/10ML IV SOLN
INTRAVENOUS | Status: DC | PRN
  Administered 2014-03-20: 10 mg via INTRAVENOUS
  Administered 2014-03-20: 30 mg via INTRAVENOUS

## 2014-03-20 MED ORDER — ADULT MULTIVITAMIN W/MINERALS CH: 1.0000 | ORAL_TABLET | Freq: Every day | ORAL | Status: DC

## 2014-03-20 MED ORDER — MENTHOL 3 MG MT LOZG: 1.0000 | LOZENGE | OROMUCOSAL | Status: DC | PRN

## 2014-03-20 MED ORDER — FENTANYL CITRATE 0.05 MG/ML IJ SOLN
INTRAMUSCULAR | Status: DC | PRN
  Administered 2014-03-20 (×2): 50 ug via INTRAVENOUS

## 2014-03-20 MED ORDER — ONDANSETRON HCL 4 MG/2ML IJ SOLN
INTRAMUSCULAR | Status: DC | PRN
Start: 2014-03-20 — End: 2014-03-20
  Administered 2014-03-20: 4 mg via INTRAVENOUS

## 2014-03-20 MED ORDER — CEFAZOLIN SODIUM-DEXTROSE 2-3 GM-% IV SOLR
INTRAVENOUS | Status: DC | PRN
  Administered 2014-03-20: 2 g via INTRAVENOUS

## 2014-03-20 MED ORDER — DILTIAZEM HCL 60 MG PO TABS
60.0000 mg | ORAL_TABLET | Freq: Two times a day (BID) | ORAL | Status: DC
  Administered 2014-03-20 – 2014-03-23 (×6): 60 mg via ORAL
  Filled 2014-03-20 (×7): qty 1

## 2014-03-20 MED ORDER — GLYCOPYRROLATE 0.2 MG/ML IJ SOLN
INTRAMUSCULAR | Status: DC | PRN
  Administered 2014-03-20: 0.6 mg via INTRAVENOUS

## 2014-03-20 MED ORDER — PSYLLIUM 95 % PO PACK: 1.0000 | PACK | Freq: Every day | ORAL | Status: DC

## 2014-03-20 MED ORDER — OMEGA-3-ACID ETHYL ESTERS 1 G PO CAPS
1.0000 g | ORAL_CAPSULE | Freq: Every day | ORAL | Status: DC
  Administered 2014-03-21 – 2014-03-23 (×2): 1 g via ORAL
  Filled 2014-03-20 (×3): qty 1

## 2014-03-20 MED ORDER — HYDROCODONE-ACETAMINOPHEN 5-325 MG PO TABS
1.0000 | ORAL_TABLET | Freq: Four times a day (QID) | ORAL | Status: DC | PRN
  Administered 2014-03-20 – 2014-03-22 (×5): 2 via ORAL
  Administered 2014-03-22: 1 via ORAL
  Administered 2014-03-23: 2 via ORAL
  Filled 2014-03-20: qty 1
  Filled 2014-03-20 (×5): qty 2

## 2014-03-20 MED ORDER — OMEPRAZOLE MAGNESIUM 20 MG PO TBEC: 20.0000 mg | DELAYED_RELEASE_TABLET | Freq: Every day | ORAL | Status: DC

## 2014-03-20 MED ORDER — WARFARIN SODIUM 5 MG PO TABS: 5.0000 mg | ORAL_TABLET | Freq: Every morning | ORAL | Status: DC

## 2014-03-20 MED ORDER — LACTATED RINGERS IV SOLN
INTRAVENOUS | Status: DC | PRN
  Administered 2014-03-20 (×2): via INTRAVENOUS

## 2014-03-20 MED ORDER — DOCUSATE SODIUM 100 MG PO CAPS: 100.0000 mg | ORAL_CAPSULE | Freq: Two times a day (BID) | ORAL | Status: DC

## 2014-03-20 MED ORDER — MEPERIDINE HCL 25 MG/ML IJ SOLN: 6.2500 mg | INTRAMUSCULAR | Status: DC | PRN

## 2014-03-20 MED ORDER — METOCLOPRAMIDE HCL 10 MG PO TABS: 5.0000 mg | ORAL_TABLET | Freq: Three times a day (TID) | ORAL | Status: DC | PRN

## 2014-03-20 MED ORDER — POLYETHYLENE GLYCOL 3350 17 G PO PACK: 17.0000 g | PACK | Freq: Every day | ORAL | Status: DC | PRN

## 2014-03-20 MED ORDER — METOPROLOL TARTRATE 1 MG/ML IV SOLN
INTRAVENOUS | Status: DC | PRN
  Administered 2014-03-20: 2 mg via INTRAVENOUS

## 2014-03-20 MED ORDER — CEFAZOLIN SODIUM 1-5 GM-% IV SOLN
1.0000 g | Freq: Four times a day (QID) | INTRAVENOUS | Status: AC
  Administered 2014-03-20 – 2014-03-21 (×2): 1 g via INTRAVENOUS
  Filled 2014-03-20 (×2): qty 50

## 2014-03-20 MED ORDER — PHENYLEPHRINE HCL 10 MG/ML IJ SOLN
INTRAMUSCULAR | Status: DC | PRN
  Administered 2014-03-20 (×2): 80 ug via INTRAVENOUS

## 2014-03-20 MED ORDER — OCUVITE-LUTEIN PO CAPS
1.0000 | ORAL_CAPSULE | Freq: Two times a day (BID) | ORAL | Status: DC
  Administered 2014-03-20 – 2014-03-22 (×4): 1 via ORAL
  Filled 2014-03-20 (×7): qty 1

## 2014-03-20 MED ORDER — FENTANYL CITRATE 0.05 MG/ML IJ SOLN: 25.0000 ug | INTRAMUSCULAR | Status: DC | PRN

## 2014-03-20 MED ORDER — ONDANSETRON HCL 4 MG PO TABS: 4.0000 mg | ORAL_TABLET | Freq: Four times a day (QID) | ORAL | Status: DC | PRN

## 2014-03-20 MED ORDER — ONDANSETRON HCL 4 MG/2ML IJ SOLN: 4.0000 mg | Freq: Four times a day (QID) | INTRAMUSCULAR | Status: DC | PRN

## 2014-03-20 MED ORDER — WARFARIN SODIUM 5 MG PO TABS: 5.0000 mg | ORAL_TABLET | Freq: Every day | ORAL | Status: DC

## 2014-03-20 MED ORDER — ARTIFICIAL TEARS OP OINT
TOPICAL_OINTMENT | OPHTHALMIC | Status: DC | PRN
  Administered 2014-03-20: 1 via OPHTHALMIC

## 2014-03-20 MED ORDER — PHENYLEPHRINE HCL 10 MG/ML IJ SOLN
10.0000 mg | INTRAMUSCULAR | Status: DC | PRN
  Administered 2014-03-20: 20 ug/min via INTRAVENOUS

## 2014-03-20 MED ORDER — FENTANYL CITRATE 0.05 MG/ML IJ SOLN
INTRAMUSCULAR | Status: AC
  Filled 2014-03-20: qty 5

## 2014-03-20 MED ORDER — TRAMADOL-ACETAMINOPHEN 37.5-325 MG PO TABS: 1.0000 | ORAL_TABLET | Freq: Four times a day (QID) | ORAL | Status: DC | PRN

## 2014-03-20 MED ORDER — SODIUM CHLORIDE 0.9 % IR SOLN
Status: DC | PRN
  Administered 2014-03-20: 1000 mL

## 2014-03-20 MED ORDER — HYDROCODONE-ACETAMINOPHEN 5-325 MG PO TABS
ORAL_TABLET | ORAL | Status: AC
  Administered 2014-03-20: 2 via ORAL
  Filled 2014-03-20: qty 2

## 2014-03-20 MED ORDER — ACETAMINOPHEN 325 MG PO TABS
650.0000 mg | ORAL_TABLET | Freq: Four times a day (QID) | ORAL | Status: DC | PRN
  Administered 2014-03-20 – 2014-03-22 (×3): 650 mg via ORAL
  Filled 2014-03-20 (×4): qty 2

## 2014-03-20 MED ORDER — ACETAMINOPHEN 650 MG RE SUPP: 650.0000 mg | Freq: Four times a day (QID) | RECTAL | Status: DC | PRN

## 2014-03-20 MED ORDER — LACTATED RINGERS IV SOLN
INTRAVENOUS | Status: DC
  Administered 2014-03-20: 11:00:00 via INTRAVENOUS

## 2014-03-20 MED ORDER — METOPROLOL TARTRATE 25 MG PO TABS: 25.0000 mg | ORAL_TABLET | Freq: Two times a day (BID) | ORAL | Status: DC

## 2014-03-20 MED ORDER — PROPOFOL 10 MG/ML IV BOLUS
INTRAVENOUS | Status: DC | PRN
  Administered 2014-03-20: 130 mg via INTRAVENOUS

## 2014-03-20 SURGICAL SUPPLY — 50 items
BLADE SAW SGTL 18X1.27X75 (BLADE) ×2 IMPLANT
BLADE SAW SGTL 18X1.27X75MM (BLADE) ×1
CAPT HIP HEMI 1 ×3 IMPLANT
COVER SURGICAL LIGHT HANDLE (MISCELLANEOUS) ×3 IMPLANT
DERMABOND ADVANCED (GAUZE/BANDAGES/DRESSINGS) ×2
DERMABOND ADVANCED .7 DNX12 (GAUZE/BANDAGES/DRESSINGS) ×1 IMPLANT
DRAPE IMP U-DRAPE 54X76 (DRAPES) ×3 IMPLANT
DRAPE INCISE IOBAN 85X60 (DRAPES) ×3 IMPLANT
DRAPE ORTHO SPLIT 77X108 STRL (DRAPES) ×4
DRAPE SURG ORHT 6 SPLT 77X108 (DRAPES) ×2 IMPLANT
DRAPE U-SHAPE 47X51 STRL (DRAPES) ×3 IMPLANT
DRSG AQUACEL AG ADV 3.5X10 (GAUZE/BANDAGES/DRESSINGS) ×3 IMPLANT
DURAPREP 26ML APPLICATOR (WOUND CARE) ×3 IMPLANT
ELECT BLADE 4.0 EZ CLEAN MEGAD (MISCELLANEOUS) ×3
ELECT REM PT RETURN 9FT ADLT (ELECTROSURGICAL) ×3
ELECTRODE BLDE 4.0 EZ CLN MEGD (MISCELLANEOUS) ×1 IMPLANT
ELECTRODE REM PT RTRN 9FT ADLT (ELECTROSURGICAL) ×1 IMPLANT
EVACUATOR 1/8 PVC DRAIN (DRAIN) IMPLANT
FACESHIELD WRAPAROUND (MASK) ×6 IMPLANT
GLOVE BIOGEL PI IND STRL 7.5 (GLOVE) ×1 IMPLANT
GLOVE BIOGEL PI IND STRL 8 (GLOVE) ×2 IMPLANT
GLOVE BIOGEL PI INDICATOR 7.5 (GLOVE) ×2
GLOVE BIOGEL PI INDICATOR 8 (GLOVE) ×4
GLOVE ECLIPSE 8.0 STRL XLNG CF (GLOVE) ×3 IMPLANT
GLOVE ORTHO TXT STRL SZ7.5 (GLOVE) ×3 IMPLANT
GLOVE SURG ORTHO 8.0 STRL STRW (GLOVE) ×3 IMPLANT
GOWN STRL REIN 3XL XLG LVL4 (GOWN DISPOSABLE) ×3 IMPLANT
GOWN STRL REUS W/ TWL LRG LVL3 (GOWN DISPOSABLE) ×3 IMPLANT
GOWN STRL REUS W/TWL LRG LVL3 (GOWN DISPOSABLE) ×6
HANDPIECE INTERPULSE COAX TIP (DISPOSABLE)
IMMOBILIZER KNEE 22 UNIV (SOFTGOODS) ×3 IMPLANT
KIT BASIN OR (CUSTOM PROCEDURE TRAY) ×3 IMPLANT
KIT ROOM TURNOVER OR (KITS) ×3 IMPLANT
MANIFOLD NEPTUNE II (INSTRUMENTS) ×3 IMPLANT
NS IRRIG 1000ML POUR BTL (IV SOLUTION) ×3 IMPLANT
PACK TOTAL JOINT (CUSTOM PROCEDURE TRAY) ×3 IMPLANT
PACK UNIVERSAL I (CUSTOM PROCEDURE TRAY) ×3 IMPLANT
PAD ARMBOARD 7.5X6 YLW CONV (MISCELLANEOUS) ×6 IMPLANT
SET HNDPC FAN SPRY TIP SCT (DISPOSABLE) IMPLANT
SPONGE LAP 4X18 X RAY DECT (DISPOSABLE) ×6 IMPLANT
SUT MNCRL AB 4-0 PS2 18 (SUTURE) IMPLANT
SUT VIC AB 1 CT1 27 (SUTURE) ×4
SUT VIC AB 1 CT1 27XBRD ANBCTR (SUTURE) ×2 IMPLANT
SUT VIC AB 2-0 CT1 27 (SUTURE)
SUT VIC AB 2-0 CT1 TAPERPNT 27 (SUTURE) IMPLANT
SUT VLOC 180 0 24IN GS25 (SUTURE) ×3 IMPLANT
TOWEL OR 17X24 6PK STRL BLUE (TOWEL DISPOSABLE) ×3 IMPLANT
TOWEL OR 17X26 10 PK STRL BLUE (TOWEL DISPOSABLE) ×3 IMPLANT
TRAY FOLEY CATH 14FR (SET/KITS/TRAYS/PACK) IMPLANT
WATER STERILE IRR 1000ML POUR (IV SOLUTION) ×12 IMPLANT

## 2014-03-20 NOTE — Progress Notes (Signed)
PT Cancellation Note  Patient Details Name: OTNIEL HOE MRN: [id number] DOB: Feb 16, 1916   Cancelled Treatment:    Reason Eval/Treat Not Completed: Patient not medically ready. Pt planned for surgery later this afternoon. Will re-evaluate when medically ready.    Elie Confer Huson, [place] 03/20/2014, 8:33 AM

## 2014-03-20 NOTE — Progress Notes (Addendum)
ANTICOAGULATION CONSULT NOTE - Initial Consult  Pharmacy Consult for coumadin Indication: VTE prophylaxis  No Known Allergies   Vital Signs: Temp: 97.9 F (36.6 C) (01/22 1602) Temp Source: Oral (01/22 1034) BP: 119/76 mmHg (01/22 1715) Pulse Rate: 90 (01/22 1700)  Labs:  Recent Labs  03/17/14 2252 03/18/14 0510 03/19/14 0643 03/20/14 0452  HGB  --  13.4 13.1  --   HCT  --  40.1 39.6  --   PLT  --  72* 63*  --   APTT 48*  --   --   --   LABPROT 26.8* 26.2* 22.8* 19.4*  INR 2.45* 2.38* 1.99* 1.62*  CREATININE  --  0.91 0.80  --     CrCl cannot be calculated (Unknown ideal weight.).   Medical History: Past Medical History  Diagnosis Date  . Hypertension   . High cholesterol   . Heart trouble   . Osteoarthritis   . Bone cancer   . Bilateral swelling of feet     Medications:  Prescriptions prior to admission  Medication Sig Dispense Refill Last Dose  . atorvastatin (LIPITOR) 10 MG tablet Take 10 mg by mouth daily.   03/16/2014 at Unknown time  . CHONDROITIN SULFATE PO Take 1 tablet by mouth 2 (two) times daily.    03/17/2014 at Unknown time  . diltiazem (CARDIZEM) 60 MG tablet Take 60 mg by mouth 2 (two) times daily.    03/17/2014 at am  . Glucosamine 750 MG TABS Take 750 mg by mouth 3 (three) times daily.   03/17/2014 at Unknown time  . metoprolol (LOPRESSOR) 50 MG tablet Take 25 mg by mouth 2 (two) times daily.   03/17/2014 at 0900  . Multiple Vitamin (MULTIVITAMIN WITH MINERALS) TABS tablet Take 1 tablet by mouth daily.   03/17/2014 at Unknown time  . Multiple Vitamins-Minerals (PRESERVISION AREDS 2) CAPS Take 1 tablet by mouth 2 (two) times daily.   03/17/2014 at am  . Omega 3 1200 MG CAPS Take 1,200 mg by mouth daily.   03/17/2014 at Unknown time  . omeprazole (PRILOSEC OTC) 20 MG tablet Take 20 mg by mouth daily.   03/17/2014 at Unknown time  . psyllium (METAMUCIL) 58.6 % packet Take 1 packet by mouth daily.   03/17/2014 at Unknown time  . warfarin (COUMADIN) 5 MG  tablet Take 5-7.5 mg by mouth every morning. Takes 5mg  on Sun and Tues Takes 7.5mg  all other days   03/17/2014 at Unknown time   Scheduled:  . aspirin  81 mg Oral Daily  . atorvastatin  10 mg Oral Daily  .  ceFAZolin (ANCEF) IV  1 g Intravenous 4 times per day  . diltiazem  180 mg Oral Daily  . diltiazem  60 mg Oral BID  . docusate sodium  100 mg Oral BID  . docusate sodium  100 mg Oral BID  . feeding supplement (ENSURE COMPLETE)  237 mL Oral BID BM  . [START ON 03/21/2014] ferrous sulfate  325 mg Oral BID WC  . metoprolol tartrate  25 mg Oral BID  . metoprolol  25 mg Oral BID  . multivitamin with minerals  1 tablet Oral Daily  . Omega 3  1,200 mg Oral Daily  . omeprazole  20 mg Oral Daily  . pantoprazole  40 mg Oral Daily  . PRESERVISION AREDS 2  1 tablet Oral BID  . psyllium  1 packet Oral Daily  . psyllium  1 packet Oral Daily  . rosuvastatin  10  mg Oral q1800  . [START ON 03/21/2014] warfarin  5-7.5 mg Oral q morning - 10a    Assessment: 81 yo male s/p fall with L femoral fracture now s/p repair. He was on coumadin PTA for afib and pharmacy has been consulted to dose for VTE prophylaxis s/p surgery.   Current notes by Dr. Hartford Poli state plans to discontinue coumadin. Spoke with Dr. Hartford Poli tonight and he does not want coumadin to be started.  He will plan an alternative method of anticoagulation for VTE prophylaxis on 03/21/13.   Plan:   -No coumadin therapy per Dr. Hartford Poli -Will discontinue coumadin consult per pharmacy at the request of Dr. Hartford Poli -Will follow anticoagulation plans on 03/21/14  Alain Marion D 03/20/2014 6:26 PM

## 2014-03-20 NOTE — Progress Notes (Signed)
[facility] PROGRESS NOTE   [name] Chang BUL:[id number] DOB: 12-08-1915 DOA: 03/17/2014 PCP: No primary care provider on file.  HPI/Subjective: Patient for surgery today with Dr. Alvan Dame.  Assessment/Plan: Active Problems:   Essential hypertension   Coronary atherosclerosis   Atrial fibrillation   Left displaced femoral neck fracture   Distal radius fracture, left   Hyperlipidemia   Closed left hip fracture   Left NON-displaced fracture of the femoral neck -Admitted after fall, with left radial fracture and left subcapital impacted femoral neck fracture. -CT scan showed impacted subcapital femoral neck fracture. -Seen by orthopedics, initially recommended ambulation if he tolerates that no surgery. -Was not able to ambulate with PT, patient for surgery in a.m. -Control pain with narcotics, DVT prophylaxis with SCD, was on Coumadin INR is 1.99.  Left Fracture of Radius -splinted in the ED -Control pain with narcotics  Atrial Fibrillation -currently rate controlled -continue with home medications -will monitor  CAD -stable at this time  Hypertension -monitor pressures -will continue with home medications  Thrombocytopenia -Appears to be chronic, monitor infrequently.  Chronic Anticoagulation -On Coumadin, Coumadin discontinued -Patient demonstrated high risk for falls along with his advanced age I think he should be better off of Coumadin. -We'll start low-dose aspirin for his atrial fibrillation.  Code Status: Full Code Family Communication: Plan discussed with the patient. Disposition Plan: Remains inpatient Diet: Diet NPO time specified  Consultants:  Dr. Rolena Infante of orthopedics  Procedures:  Casting of left forearm done in the ED.  Antibiotics:  None   Objective: Filed Vitals:   03/20/14 1034  BP: 151/75  Pulse: 96  Temp: 99 F (37.2 C)  Resp: 18    Intake/Output Summary (Last 24 hours) at 03/20/14 1342 Last data filed at 03/20/14  0405  Gross per 24 hour  Intake    240 ml  Output    525 ml  Net   -285 ml   There were no vitals filed for this visit.  Exam: General: Alert and awake, oriented x3, not in any acute distress. HEENT: anicteric sclera, pupils reactive to light and accommodation, EOMI CVS: S1-S2 clear, no murmur rubs or gallops Chest: clear to auscultation bilaterally, no wheezing, rales or rhonchi Abdomen: soft nontender, nondistended, normal bowel sounds, no organomegaly Extremities: no cyanosis, clubbing or edema noted bilaterally Neuro: Cranial nerves II-XII intact, no focal neurological deficits  Data Reviewed: Basic Metabolic Panel:  Recent Labs Lab 03/17/14 1525 03/18/14 0510 03/19/14 0643  NA 137 136 134*  K 4.4 4.6 4.1  CL 106 104 101  CO2 24 27 24   GLUCOSE 168* 193* 154*  BUN 17 22 20   CREATININE 0.81 0.91 0.80  CALCIUM 9.0 8.9 8.7   Liver Function Tests: No results for input(s): AST, ALT, ALKPHOS, BILITOT, PROT, ALBUMIN in the last 168 hours. No results for input(s): LIPASE, AMYLASE in the last 168 hours. No results for input(s): AMMONIA in the last 168 hours. CBC:  Recent Labs Lab 03/17/14 1525 03/18/14 0510 03/19/14 0643  WBC 8.1 11.8* 10.1  NEUTROABS 6.4  --   --   HGB 13.1 13.4 13.1  HCT 39.4 40.1 39.6  MCV 95.4 95.0 95.4  PLT 102* 72* 63*   Cardiac Enzymes: No results for input(s): CKTOTAL, CKMB, CKMBINDEX, TROPONINI in the last 168 hours. BNP (last 3 results) No results for input(s): PROBNP in the last 8760 hours. CBG: No results for input(s): GLUCAP in the last 168 hours.  Micro Recent Results (from the past 240 hour(s))  MRSA PCR Screening     Status: None   Collection Time: 03/20/14  7:45 AM  Result Value Ref Range Status   MRSA by PCR NEGATIVE NEGATIVE Final    Comment:        The GeneXpert MRSA Assay (FDA approved for NASAL specimens only), is one component of a comprehensive MRSA colonization surveillance program. It is not intended to  diagnose MRSA infection nor to guide or monitor treatment for MRSA infections.      Studies: No results found.  Scheduled Meds: . [MAR Hold] aspirin  81 mg Oral Daily  . [MAR Hold] diltiazem  180 mg Oral Daily  . [MAR Hold] docusate sodium  100 mg Oral BID  . [MAR Hold] feeding supplement (ENSURE COMPLETE)  237 mL Oral BID BM  . [MAR Hold] metoprolol tartrate  25 mg Oral BID  . [MAR Hold] pantoprazole  40 mg Oral Daily  . [MAR Hold] psyllium  1 packet Oral Daily  . [MAR Hold] rosuvastatin  10 mg Oral q1800   Continuous Infusions: . lactated ringers 10 mL/hr at 03/20/14 1126       Time spent: 35 minutes    [facility]  [facility] Pager [phone] If 7PM-7AM, please contact night-coverage at [email or web address], password [facility] 03/20/2014, 1:42 PM  LOS: 3 days

## 2014-03-20 NOTE — Anesthesia Preprocedure Evaluation (Addendum)
Anesthesia Evaluation   Patient awake and Patient confused    Reviewed: Allergy & Precautions, NPO status , Patient's Chart, lab work & pertinent test results, reviewed documented beta blocker date and time   Airway Mallampati: II  TM Distance: >3 FB Neck ROM: Limited    Dental  (+) Teeth Intact   Pulmonary  breath sounds clear to auscultation        Cardiovascular hypertension, Pt. on medications + dysrhythmias Atrial Fibrillation Rhythm:Irregular Rate:Tachycardia  ECHO ordered for today   Neuro/Psych    GI/Hepatic GERD-  Medicated,  Endo/Other    Renal/GU      Musculoskeletal   Abdominal   Peds  Hematology 13/39 PLTS 66K   Anesthesia Other Findings   Reproductive/Obstetrics                           Anesthesia Physical Anesthesia Plan  ASA: III  Anesthesia Plan: General   Post-op Pain Management:    Induction: Intravenous  Airway Management Planned: Oral ETT  Additional Equipment:   Intra-op Plan:   Post-operative Plan: Extubation in OR  Informed Consent: I have reviewed the patients History and Physical, chart, labs and discussed the procedure including the risks, benefits and alternatives for the proposed anesthesia with the patient or authorized representative who has indicated his/her understanding and acceptance.     Plan Discussed with: CRNA and Anesthesiologist  Anesthesia Plan Comments:        Anesthesia Quick Evaluation

## 2014-03-20 NOTE — H&P (View-Only) (Signed)
    Subjective:     Patient reports pain as 4 on 0-10 scale.   Denies CP or SOB.  Voiding without difficulty. Positive flatus. Objective: Vital signs in last 24 hours: Temp:  [98 F (36.7 C)-98.6 F (37 C)] 98 F (36.7 C) (01/21 0600) Pulse Rate:  [83-99] 85 (01/21 0600) Resp:  [18-20] 18 (01/21 0600) BP: (120-139)/(62-83) 120/62 mmHg (01/21 0600) SpO2:  [93 %-100 %] 93 % (01/21 0600)  Intake/Output from previous day: 01/20 0701 - 01/21 0700 In: 840 [P.O.:840] Out: 500 [Urine:500] Intake/Output this shift:    Labs:  Recent Labs  03/17/14 1525 03/18/14 0510 03/19/14 0643  HGB 13.1 13.4 13.1    Recent Labs  03/18/14 0510 03/19/14 0643  WBC 11.8* 10.1  RBC 4.22 4.15*  HCT 40.1 39.6  PLT 72* 63*    Recent Labs  03/18/14 0510 03/19/14 0643  NA 136 134*  K 4.6 4.1  CL 104 101  CO2 27 24  BUN 22 20  CREATININE 0.91 0.80  GLUCOSE 193* 154*  CALCIUM 8.9 8.7    Recent Labs  03/18/14 0510 03/19/14 0643  INR 2.38* 1.99*    Physical Exam: Neurologically intact ABD soft Compartment soft hip pain worse this AM  Assessment/Plan: Spoke with Dr Alvan Dame Will plan on hemi-arthrioplasty in AM  Patient and family are in agreement with surgical plan Patients pain and function are worsening.    Spoke with medical team  Also speak with anesthesia about epidural/spinal   [name] Chang for Dr. Melina Schools [facility] [phone] 03/19/2014, 9:49 AM

## 2014-03-20 NOTE — Progress Notes (Signed)
Asked to get involved with management of left femoral neck fracture [address] in driveway on day of admission sustaining injuries to hi left side including left hip fracture and left distal radius fracture.  Reviewed with patient and his daughter planned surgery NPO To OR today for left hip hemiarthroplasty

## 2014-03-20 NOTE — Op Note (Signed)
NAME:  [name] Chang                ACCOUNT NO.:  [id number]   MEDICAL RECORD NO.: [id number]   LOCATION:  3474                         FACILITY:  [facility]   DATE OF BIRTH:  03-12-2015  PHYSICIAN:  Pietro Cassis. Alvan Dame, M.D.     DATE OF PROCEDURE:  03/20/14                               OPERATIVE REPORT     PREOPERATIVE DIAGNOSIS:  Left displaced femoral neck fracture.   POSTOPERATIVE DIAGNOSIS:  Left displaced femoral neck fracture.   PROCEDURE:  Left hip hemiarthroplasty utilizing DePuy component, size 6 standard Fiserv Basic stem with a 47 unipolar ball with a +0 adapter.   SURGEON:  Pietro Cassis. Alvan Dame, MD   ASSISTANT:  Molli Barrows, PA-C   ANESTHESIA:  General.   SPECIMENS:  None.   DRAINS:  None.   BLOOD LOSS:  About 75 cc.   COMPLICATIONS:  None.   INDICATION OF PROCEDURE:  [name] Chang is a 81 year old male who lives independently.  He unfortunately had a fall at his house when trying to get in car to leave house.  He was admitted to the hospital after radiographs revealed a femoral neck fracture.  He had also sustained a left distal radius fracture.  He was seen and evaluated and was scheduled for surgery for fixation.  The necessity of surgical repair was discussed with him and his family.  Consent was obtained after reviewing risks of infection, DVT, component failure, and need for revision surgery.  He takes coumadin and at time of surgery his INR was noted to be 1.9.  After discussing with anesthesia we felt that general anesthesia would safe and to proceed with procedure.   PROCEDURE IN DETAIL:  The patient was brought to the operative theater. Once adequate anesthesia, preoperative antibiotics, 2 g of Ancef administered, the patient was positioned into the right lateral decubitus position with the left side up.  The left lower extremity was then prepped and draped in sterile fashion.  A time-out was performed identifying the patient, planned procedure, and  extremity.   A lateral incision was made off the proximal trochanter. Sharp dissection was carried down to the iliotibial band and gluteal fascia. The gluteal fascia was then incised for posterior approach.  The short external rotators were taken down separate from the posterior capsule. An L capsulotomy was made preserving the posterior leaflet for later anatomic repair. Fracture site was identified and after removing comminuted segments of the posterior femoral neck, the femoral head was removed without difficulty and measured on the back table  using the sizing rings and determined to be 47 mm in diameter.   The proximal femur was then exposed.  Retractors placed.  I then drilled, opened the proximal femur.  Then I hand reamed once and  Irrigated the canal to try to prevent fat emboli.  I began broaching the femur with a starting size 1 broach up to a size 6 broach with good medial and lateral metaphyseal fit without evidence of any torsion or movement.  A trial reduction was carried out with a standard neck and a +0 adapter with a 74mm ball.  The hip reduced nicely.  The leg lengths  appeared to be equal compared to the down leg.   The hip went through a range of motion without evidence of any subluxation or impingement.   Given these findings, the trial components removed.  The final size 6  Press fit Summit Basic stem was opened.  After irrigating the canal, the final stem was impacted and sat at the level where the broach was. Based on this and the trial reduction, a +0 adapter was opened and impacted in the 77mm unipolar ball onto a clean and dry trunnion.  The hip had been irrigated throughout the case and again at this point.  I re- Approximated the posterior capsule to the superior leaflet using a  #1 Vicryl.  The remainder of the wound was closed with #1 Vicryl and  #0 V-lock sutures in the iliotibial band and gluteal fascia, a  2-0 Vicryl in the sub-Q tissue and a running  4-0 Monocryl in the skin.  The hip was cleaned, dried, and dressed sterilely using Dermabond and Aquacel dressing.  He was then brought to recovery room, extubated in stable condition, tolerating the procedure well.  Molli Barrows PA-C was present and utilized as Environmental consultant for the entire case from  Preoperative positioning to management of the contralateral extremity and retractors to  General facilitation of the procedure.  He was also involved with primary wound closure.         Pietro Cassis Alvan Dame, M.D.

## 2014-03-20 NOTE — Transfer of Care (Signed)
Immediate Anesthesia Transfer of Care Note  Patient: [name] Chang  Procedure(s) Performed: Procedure(s): ARTHROPLASTY BIPOLAR HIP (Left)  Patient Location: PACU  Anesthesia Type:General  Level of Consciousness: awake, alert  and patient cooperative  Airway & Oxygen Therapy: Patient Spontanous Breathing and Patient connected to nasal cannula oxygen  Post-op Assessment: Report given to PACU RN and Post -op Vital signs reviewed and stable  Post vital signs: Reviewed and stable  Complications: No apparent anesthesia complications

## 2014-03-20 NOTE — Progress Notes (Signed)
Patient ID: [name] Chang, male   DOB: 09-24-1915, 81 y.o.   MRN: [id number] Plans for hip surgery noted Left wrist is notable for fracture but we would not recommend surgical intervention at this time given all issues and aspects of his fracture pattern, soft tissue and surrounding objective findings  We'll continue to watch with general orthopedics  Jerald Villalona M.D.

## 2014-03-20 NOTE — Anesthesia Postprocedure Evaluation (Signed)
  Anesthesia Post-op Note  Patient: [name] Chang  Procedure(s) Performed: Procedure(s): ARTHROPLASTY BIPOLAR HIP (Left)  Patient Location: PACU  Anesthesia Type:General  Level of Consciousness: awake, alert  and oriented  Airway and Oxygen Therapy: Patient Spontanous Breathing and Patient connected to nasal cannula oxygen  Post-op Pain: none  Post-op Assessment: Post-op Vital signs reviewed, Patient's Cardiovascular Status Stable, Respiratory Function Stable, Patent Airway, No signs of Nausea or vomiting and Pain level controlled  Post-op Vital Signs: stable  Last Vitals:  Filed Vitals:   03/20/14 1715  BP: 119/76  Pulse:   Temp:   Resp: 17    Complications: No apparent anesthesia complications

## 2014-03-20 NOTE — Interval H&P Note (Signed)
History and Physical Interval Note:  03/20/2014 2:37 PM  [name] Chang  has presented today for surgery, with the diagnosis of left fem neck fx  The various methods of treatment have been discussed with the patient and family. After consideration of risks, benefits and other options for treatment, the patient has consented to  Procedure(s): ARTHROPLASTY BIPOLAR HIP (Left) as a surgical intervention .  The patient's history has been reviewed, patient examined, no change in status, stable for surgery.  I have reviewed the patient's chart and labs.  Questions were answered to the patient's satisfaction.     Mauri Pole

## 2014-03-20 NOTE — Progress Notes (Signed)
Orthopedic Tech Progress Note Patient Details:  [name] Chang 1915/05/13 [id number] Pt. unable to use OHF with trapeze due to lack of upper body strength and splinted finger.      Darrol Poke 03/20/2014, 8:57 PM

## 2014-03-21 ENCOUNTER — Inpatient Hospital Stay (HOSPITAL_COMMUNITY): Payer: Medicare Other

## 2014-03-21 LAB — CBC
HCT: 35.7 % — ABNORMAL LOW (ref 39.0–52.0)
Hemoglobin: 11.8 g/dL — ABNORMAL LOW (ref 13.0–17.0)
MCH: 31.3 pg (ref 26.0–34.0)
MCHC: 33.1 g/dL (ref 30.0–36.0)
MCV: 94.7 fL (ref 78.0–100.0)
Platelets: 75 10*3/uL — ABNORMAL LOW (ref 150–400)
RBC: 3.77 MIL/uL — ABNORMAL LOW (ref 4.22–5.81)
RDW: 14.1 % (ref 11.5–15.5)
WBC: 8 10*3/uL (ref 4.0–10.5)

## 2014-03-21 LAB — BASIC METABOLIC PANEL
ANION GAP: 13 (ref 5–15)
BUN: 19 mg/dL (ref 6–23)
CALCIUM: 8.2 mg/dL — AB (ref 8.4–10.5)
CO2: 19 mmol/L (ref 19–32)
Chloride: 100 mmol/L (ref 96–112)
Creatinine, Ser: 0.71 mg/dL (ref 0.50–1.35)
GFR calc Af Amer: 89 mL/min — ABNORMAL LOW (ref >90)
GFR calc non Af Amer: 77 mL/min — ABNORMAL LOW (ref >90)
GLUCOSE: 148 mg/dL — AB (ref 70–99)
POTASSIUM: 3.7 mmol/L (ref 3.5–5.1)
SODIUM: 132 mmol/L — AB (ref 135–145)

## 2014-03-21 LAB — PROTIME-INR
INR: 1.45 (ref 0.00–1.49)
Prothrombin Time: 17.8 seconds — ABNORMAL HIGH (ref 11.6–15.2)

## 2014-03-21 MED ORDER — FUROSEMIDE 10 MG/ML IJ SOLN
40.0000 mg | Freq: Once | INTRAMUSCULAR | Status: AC
  Administered 2014-03-21: 40 mg via INTRAVENOUS
  Filled 2014-03-21: qty 4

## 2014-03-21 NOTE — Progress Notes (Signed)
    Subjective: 1 Day Post-Op Procedure(s) (LRB): ARTHROPLASTY BIPOLAR HIP (Left) Patient reports pain as 3 on 0-10 scale.   Denies CP or SOB.  Voiding without difficulty. Positive flatus. Objective: Vital signs in last 24 hours: Temp:  [97.6 F (36.4 C)-99 F (37.2 C)] 97.6 F (36.4 C) (01/23 0454) Pulse Rate:  [66-101] 80 (01/23 0454) Resp:  [14-21] 18 (01/23 0454) BP: (92-163)/(47-87) 100/54 mmHg (01/23 0454) SpO2:  [90 %-100 %] 90 % (01/23 0454)  Intake/Output from previous day: 01/22 0701 - 01/23 0700 In: 2100 [P.O.:480; I.V.:1500; IV Piggyback:120] Out: 75 [Blood:75] Intake/Output this shift:    Labs:  Recent Labs  03/19/14 0643 03/21/14 0413  HGB 13.1 11.8*    Recent Labs  03/19/14 0643 03/21/14 0413  WBC 10.1 8.0  RBC 4.15* 3.77*  HCT 39.6 35.7*  PLT 63* 75*    Recent Labs  03/19/14 0643 03/21/14 0413  NA 134* 132*  K 4.1 3.7  CL 101 100  CO2 24 19  BUN 20 19  CREATININE 0.80 0.71  GLUCOSE 154* 148*  CALCIUM 8.7 8.2*    Recent Labs  03/20/14 0452 03/21/14 0413  INR 1.62* 1.45    Physical Exam: Neurologically intact ABD soft Intact pulses distally Dorsiflexion/Plantar flexion intact Incision: dressing C/D/I Compartment soft  Assessment/Plan: 1 Day Post-Op Procedure(s) (LRB): ARTHROPLASTY BIPOLAR HIP (Left) Advance diet Up with therapy D/C IV fluids  Doing well overall  Sia Gabrielsen D for Dr. Melina Schools [facility] [phone] 03/21/2014, 8:25 AM

## 2014-03-21 NOTE — Evaluation (Signed)
Physical Therapy Evaluation Patient Details Name: [name] Chang MRN: [id number] DOB: [birth year] Today's Date: 03/21/2014   History of Present Illness  81 yo male who sustained a fall and now has received hemiarthroplasty to L hip and L distal radial fracture with  splint awaiting casting.   Clinical Impression  Pt was seen for re-evaluation of his abiltiy to walk and move since surgical correction of L hip and splinting LUE.  Pt in pain and had low O2 sats that made standing prohibitive, but then could attend to sitting control for PT.  SNF still planned.    Follow Up Recommendations SNF;Supervision/Assistance - 24 hour    Equipment Recommendations  Rolling walker with 5" wheels;Other (comment) (platform attachments)    Recommendations for Other Services       Precautions / Restrictions Precautions Precautions: Fall Restrictions Weight Bearing Restrictions: Yes LUE Weight Bearing: Weight bear through elbow only LLE Weight Bearing: Weight bearing as tolerated Other Position/Activity Restrictions: clarification received to wb through platform per MD and platform walker in room      Mobility  Bed Mobility Overal bed mobility: Needs Assistance;+2 for physical assistance;+ 2 for safety/equipment Bed Mobility: Supine to Sit;Sit to Supine     Supine to sit: Max assist Sit to supine: Total assist   General bed mobility comments: (A) to bring LEs into supine position and guard Lt UE; cues for NWB through Lt wrist  Transfers Overall transfer level: Needs assistance               General transfer comment: unable to stand with  PT  Ambulation/Gait                Stairs            Wheelchair Mobility    Modified Rankin (Stroke Patients Only)       Balance Overall balance assessment: Needs assistance Sitting-balance support: Bilateral upper extremity supported;Feet supported Sitting balance-Leahy Scale: Poor Sitting balance - Comments: guarded Postural  control: Posterior lean                                   Pertinent Vitals/Pain Pain Assessment: Faces Pain Score: 8  Faces Pain Scale: Hurts whole lot Pain Location: L hip only with movement Pain Intervention(s): Limited activity within patient's tolerance;Monitored during session;Premedicated before session;Repositioned    Home Living Family/patient expects to be discharged to:: Skilled nursing facility Living Arrangements: Children Available Help at Discharge: Family;Available 24 hours/day Type of Home: House Home Access: Stairs to enter   Technical brewer of Steps: 1-2   Home Equipment: Cane - single point Additional Comments: Pt lives with daughter who is disabled     Prior Function Level of Independence: Independent with assistive device(s)         Comments: Wife reports he was using cane and due to knee OA walked very slowly     Hand Dominance   Dominant Hand: Right    Extremity/Trunk Assessment   Upper Extremity Assessment: LUE deficits/detail       LUE Deficits / Details: no wgt through wrist and hand but can wb on elbow   Lower Extremity Assessment: Generalized weakness      Cervical / Trunk Assessment: Normal  Communication   Communication: HOH  Cognition Arousal/Alertness: Awake/alert Behavior During Therapy: WFL for tasks assessed/performed Overall Cognitive Status: Within Functional Limits for tasks assessed  General Comments General comments (skin integrity, edema, etc.): Pt was able to sit but has low O2 sats when PT attempted to stand.  Sats were monitored by nursing and got down to 80% but improved with deep breathing, best when in bed and may have been holding his breath.    Exercises        Assessment/Plan    PT Assessment Patient needs continued PT services  PT Diagnosis Difficulty walking;Generalized weakness   PT Problem List Decreased strength;Decreased range of  motion;Decreased activity tolerance;Decreased balance;Decreased mobility;Decreased coordination;Decreased cognition;Decreased knowledge of use of DME;Decreased safety awareness;Decreased knowledge of precautions;Obesity;Decreased skin integrity;Pain  PT Treatment Interventions DME instruction;Gait training;Functional mobility training;Balance training;Therapeutic exercise;Therapeutic activities;Neuromuscular re-education;Patient/family education;Wheelchair mobility training   PT Goals (Current goals can be found in the Care Plan section) Acute Rehab PT Goals Patient Stated Goal: to get back to being independent  PT Goal Formulation: With patient/family Time For Goal Achievement: 04/04/14 Potential to Achieve Goals: Good    Frequency Min 3X/week   Barriers to discharge Decreased caregiver support;Inaccessible home environment      Co-evaluation               End of Session Equipment Utilized During Treatment: Other (comment) (L arm sling) Activity Tolerance: Patient limited by pain;Patient limited by fatigue Patient left: in bed;with call bell/phone within reach;with family/visitor present;with nursing/sitter in room Nurse Communication: Mobility status;Precautions         Time: 1459-1530 PT Time Calculation (min) (ACUTE ONLY): 31 min   Charges:   PT Evaluation $Initial PT Evaluation Tier I: 1 Procedure PT Treatments $Therapeutic Activity: 23-37 mins   PT G Codes:        Ramond Dial 2014-04-18, 4:12 PM   Mee Hives, PT MS Acute Rehab Dept. Number: [phone]

## 2014-03-21 NOTE — Progress Notes (Signed)
[facility] PROGRESS NOTE   [name] Chang NOB:[id number] DOB: 03-22-1915 DOA: 03/17/2014 PCP: No primary care provider on file.  HPI/Subjective: Seen with daughter at bedside, denies significant pain.  Assessment/Plan: Active Problems:   Essential hypertension   Coronary atherosclerosis   Atrial fibrillation   Left displaced femoral neck fracture   Distal radius fracture, left   Hyperlipidemia   Closed left hip fracture   Left NON-displaced fracture of the femoral neck -Admitted after fall, with left radial fracture and left subcapital impacted femoral neck fracture. -CT scan showed impacted subcapital femoral neck fracture. -Seen by orthopedics, initially recommended ambulation if he tolerates that no surgery. -Was not able to ambulate with PT, patient for surgery in a.m. -Control pain with narcotics, DVT prophylaxis with SCD. -S/P arthroplasty done by Dr. Alvan Dame on 1/22. -Started PT/OT, expect discharge SNF on Monday morning.  Left Fracture of Radius -splinted in the ED -Control pain with narcotics  Atrial Fibrillation -currently rate controlled -continue with home medications -will monitor  CAD -stable at this time  Hypertension -monitor pressures -will continue with home medications  Thrombocytopenia -Appears to be chronic, monitor infrequently.  Chronic Anticoagulation -On Coumadin, Coumadin discontinued -Patient demonstrated high risk for falls along with his advanced age I think he should be better off of Coumadin. -We'll start low-dose aspirin for his atrial fibrillation.   Code Status: Full Code Family Communication: Plan discussed with the patient. Disposition Plan: Remains inpatient Diet: Diet Heart  Consultants:  Dr. Rolena Infante of orthopedics  Procedures:  Casting of left forearm done in the ED.  Left hip hemiarthroplasty done by Dr. Alvan Dame on 03/20/2014.  Antibiotics:  None   Objective: Filed Vitals:   03/21/14 0454  BP: 100/54   Pulse: 80  Temp: 97.6 F (36.4 C)  Resp: 18    Intake/Output Summary (Last 24 hours) at 03/21/14 1104 Last data filed at 03/21/14 0800  Gross per 24 hour  Intake   2220 ml  Output     75 ml  Net   2145 ml   There were no vitals filed for this visit.  Exam: General: Alert and awake, oriented x3, not in any acute distress. HEENT: anicteric sclera, pupils reactive to light and accommodation, EOMI CVS: S1-S2 clear, no murmur rubs or gallops Chest: clear to auscultation bilaterally, no wheezing, rales or rhonchi Abdomen: soft nontender, nondistended, normal bowel sounds, no organomegaly Extremities: no cyanosis, clubbing or edema noted bilaterally Neuro: Cranial nerves II-XII intact, no focal neurological deficits  Data Reviewed: Basic Metabolic Panel:  Recent Labs Lab 03/17/14 1525 03/18/14 0510 03/19/14 0643 03/21/14 0413  NA 137 136 134* 132*  K 4.4 4.6 4.1 3.7  CL 106 104 101 100  CO2 24 27 24 19   GLUCOSE 168* 193* 154* 148*  BUN 17 22 20 19   CREATININE 0.81 0.91 0.80 0.71  CALCIUM 9.0 8.9 8.7 8.2*   Liver Function Tests: No results for input(s): AST, ALT, ALKPHOS, BILITOT, PROT, ALBUMIN in the last 168 hours. No results for input(s): LIPASE, AMYLASE in the last 168 hours. No results for input(s): AMMONIA in the last 168 hours. CBC:  Recent Labs Lab 03/17/14 1525 03/18/14 0510 03/19/14 0643 03/21/14 0413  WBC 8.1 11.8* 10.1 8.0  NEUTROABS 6.4  --   --   --   HGB 13.1 13.4 13.1 11.8*  HCT 39.4 40.1 39.6 35.7*  MCV 95.4 95.0 95.4 94.7  PLT 102* 72* 63* 75*   Cardiac Enzymes: No results for input(s): CKTOTAL, CKMB, CKMBINDEX,  TROPONINI in the last 168 hours. BNP (last 3 results) No results for input(s): PROBNP in the last 8760 hours. CBG: No results for input(s): GLUCAP in the last 168 hours.  Micro Recent Results (from the past 240 hour(s))  MRSA PCR Screening     Status: None   Collection Time: 03/20/14  7:45 AM  Result Value Ref Range Status    MRSA by PCR NEGATIVE NEGATIVE Final    Comment:        The GeneXpert MRSA Assay (FDA approved for NASAL specimens only), is one component of a comprehensive MRSA colonization surveillance program. It is not intended to diagnose MRSA infection nor to guide or monitor treatment for MRSA infections.      Studies: Pelvis Portable  03/20/2014   CLINICAL DATA:  Status post bipolar hip arthroplasty.  EXAM: PORTABLE PELVIS 1-2 VIEWS  COMPARISON:  03/17/2014  FINDINGS: The patient is status post left hip bipolar arthroplasty. The hardware is in anatomic alignment and there are no complicating features identified. Specifically there is no evidence for device dislocation or periprosthetic fracture. Atherosclerotic calcifications noted.  IMPRESSION: 1. No complications after left hip arthroplasty.   Electronically Signed   By: Kerby Moors M.D.   On: 03/20/2014 16:57    Scheduled Meds: . aspirin  81 mg Oral Daily  . atorvastatin  10 mg Oral Daily  . diltiazem  60 mg Oral BID  . docusate sodium  100 mg Oral BID  . feeding supplement (ENSURE COMPLETE)  237 mL Oral BID BM  . ferrous sulfate  325 mg Oral BID WC  . metoprolol tartrate  25 mg Oral BID  . multivitamin-lutein  1 capsule Oral BID  . omega-3 acid ethyl esters  1 g Oral Daily  . pantoprazole  40 mg Oral Daily  . psyllium  1 packet Oral Daily  . rosuvastatin  10 mg Oral q1800   Continuous Infusions: . lactated ringers 10 mL/hr at 03/20/14 1126  . sodium chloride 0.9 % 1,000 mL with potassium chloride 10 mEq infusion         Time spent: 35 minutes    [facility] A  [facility] Pager [phone] If 7PM-7AM, please contact night-coverage at [email or web address], password [facility] 03/21/2014, 11:04 AM  LOS: 4 days

## 2014-03-22 LAB — BASIC METABOLIC PANEL
ANION GAP: 12 (ref 5–15)
BUN: 18 mg/dL (ref 6–23)
CALCIUM: 8.2 mg/dL — AB (ref 8.4–10.5)
CHLORIDE: 96 mmol/L (ref 96–112)
CO2: 25 mmol/L (ref 19–32)
CREATININE: 0.65 mg/dL (ref 0.50–1.35)
GFR calc Af Amer: >90 mL/min (ref >90)
GFR calc non Af Amer: 80 mL/min — ABNORMAL LOW (ref >90)
GLUCOSE: 155 mg/dL — AB (ref 70–99)
Potassium: 3.7 mmol/L (ref 3.5–5.1)
SODIUM: 133 mmol/L — AB (ref 135–145)

## 2014-03-22 LAB — CBC
HCT: 36.4 % — ABNORMAL LOW (ref 39.0–52.0)
Hemoglobin: 12.2 g/dL — ABNORMAL LOW (ref 13.0–17.0)
MCH: 31.7 pg (ref 26.0–34.0)
MCHC: 33.5 g/dL (ref 30.0–36.0)
MCV: 94.5 fL (ref 78.0–100.0)
Platelets: 85 10*3/uL — ABNORMAL LOW (ref 150–400)
RBC: 3.85 MIL/uL — ABNORMAL LOW (ref 4.22–5.81)
RDW: 14.1 % (ref 11.5–15.5)
WBC: 9.2 10*3/uL (ref 4.0–10.5)

## 2014-03-22 LAB — PROTIME-INR
INR: 1.37 (ref 0.00–1.49)
PROTHROMBIN TIME: 17 s — AB (ref 11.6–15.2)

## 2014-03-22 LAB — CLOSTRIDIUM DIFFICILE BY PCR: Toxigenic C. Difficile by PCR: NEGATIVE

## 2014-03-22 NOTE — Progress Notes (Addendum)
OT Cancellation Note  Patient Details Name: [name] Chang MRN: [id number] DOB: 11-27-1915   Cancelled Treatment:    Reason Eval/Treat Not Completed: Other (comment). RN reports that pt had a difficult night and now finally sleeping. RN requested that OT/PT hold off on seeing pt at this time. Will re-attempt as available to complete evaluation.   [place] Herb M   Cyndie Chime, OTR/L Occupational Therapist [phone] (pager)  03/22/2014, 8:39 AM

## 2014-03-22 NOTE — Progress Notes (Signed)
Patient refused several medications, lunch and dinner this shift.  Soft bell given to patient d/t limitations of hands.

## 2014-03-22 NOTE — Progress Notes (Signed)
   Subjective: 2 Days Post-Op Procedure(s) (LRB): ARTHROPLASTY BIPOLAR HIP (Left) Patient reports pain as mild.   Patient seen in rounds with Dr. Wynelle Link.  Family on room. Patient is well, but has had some minor complaints of pain in the hip, requiring pain medications Plan is to go Skilled nursing facility after hospital stay.  Objective: Vital signs in last 24 hours: Temp:  [98.2 F (36.8 C)-98.6 F (37 C)] 98.6 F (37 C) (01/24 0440) Pulse Rate:  [77-84] 81 (01/24 0440) Resp:  [18] 18 (01/24 0440) BP: (102-131)/(68-82) 118/75 mmHg (01/24 0440) SpO2:  [93 %-97 %] 93 % (01/24 0440)  Intake/Output from previous day:  Intake/Output Summary (Last 24 hours) at 03/22/14 0901 Last data filed at 03/22/14 0442  Gross per 24 hour  Intake 868.33 ml  Output   1000 ml  Net -131.67 ml    Intake/Output this shift:    Labs:  Recent Labs  03/21/14 0413 03/22/14 0458  HGB 11.8* 12.2*    Recent Labs  03/21/14 0413 03/22/14 0458  WBC 8.0 9.2  RBC 3.77* 3.85*  HCT 35.7* 36.4*  PLT 75* 85*    Recent Labs  03/21/14 0413 03/22/14 0458  NA 132* 133*  K 3.7 3.7  CL 100 96  CO2 19 25  BUN 19 18  CREATININE 0.71 0.65  GLUCOSE 148* 155*  CALCIUM 8.2* 8.2*    Recent Labs  03/21/14 0413 03/22/14 0458  INR 1.45 1.37    EXAM General - Patient is Alert Extremity - Neurovascular intact Sensation intact distally Dorsiflexion/Plantar flexion intact Dressing/Incision - clean, dry Motor Function - intact, moving foot and toes well on exam.   Past Medical History  Diagnosis Date  . Hypertension   . High cholesterol   . Heart trouble   . Osteoarthritis   . Bone cancer   . Bilateral swelling of feet     Assessment/Plan: 2 Days Post-Op Procedure(s) (LRB): ARTHROPLASTY BIPOLAR HIP (Left) Active Problems:   Essential hypertension   Coronary atherosclerosis   Atrial fibrillation   Left displaced femoral neck fracture   Distal radius fracture, left  Hyperlipidemia   Closed left hip fracture  Estimated body mass index is 28.90 kg/(m^2) as calculated from the following:   Height as of 02/10/10: 5\' 8"  (1.727 m).   Weight as of 02/10/10: 86.183 kg (190 lb). Up with therapy Discharge to SNF probably tomorrow as per Medicine  DVT Prophylaxis - Aspirin Weight Bearing As Tolerated left Leg Continue therapy and then to SNF  Arlee Muslim, PA-C Orthopaedic Surgery 03/22/2014, 9:01 AM

## 2014-03-22 NOTE — Progress Notes (Signed)
[facility] PROGRESS NOTE   MICHALL NOFFKE CBS:[id number] DOB: April 27, 1915 DOA: 03/17/2014 PCP: No primary care provider on file.  HPI/Subjective: Not complaining about pain, pleasantly confused. To SNF in a.m. DVT prophylaxis will be with Lovenox on discharge.  Assessment/Plan: Active Problems:   Essential hypertension   Coronary atherosclerosis   Atrial fibrillation   Left displaced femoral neck fracture   Distal radius fracture, left   Hyperlipidemia   Closed left hip fracture   Left NON-displaced fracture of the femoral neck -Admitted after fall, with left radial fracture and left subcapital impacted femoral neck fracture. -CT scan showed impacted subcapital femoral neck fracture. -Seen by orthopedics, initially recommended ambulation if he tolerates that no surgery. -Was not able to ambulate with PT, patient for surgery in a.m. -Control pain with narcotics, DVT prophylaxis with SCD. -S/P arthroplasty done by Dr. Alvan Dame on 1/22. -Started PT/OT, expect discharge SNF on Monday morning.  Left Fracture of Radius -splinted in the ED -Control pain with narcotics  Atrial Fibrillation -currently rate controlled -continue with home medications -will monitor  CAD -stable at this time  Hypertension -monitor pressures -will continue with home medications  Thrombocytopenia -Appears to be chronic, monitor infrequently.  Chronic Anticoagulation -On Coumadin, Coumadin discontinued -Patient demonstrated high risk for falls along with his advanced age I think he should be better off of Coumadin. -Started on low-dose aspirin for his atrial fibrillation.   Code Status: Full Code Family Communication: Plan discussed with the patient. Disposition Plan: Remains inpatient Diet: Diet Heart  Consultants:  Dr. Rolena Infante of orthopedics  Procedures:  Casting of left forearm done in the ED.  Left hip hemiarthroplasty done by Dr. Alvan Dame on  03/20/2014.  Antibiotics:  None   Objective: Filed Vitals:   03/22/14 0440  BP: 118/75  Pulse: 81  Temp: 98.6 F (37 C)  Resp: 18    Intake/Output Summary (Last 24 hours) at 03/22/14 1242 Last data filed at 03/22/14 0800  Gross per 24 hour  Intake 1108.33 ml  Output   1000 ml  Net 108.33 ml   There were no vitals filed for this visit.  Exam: General: Alert and awake, oriented x3, not in any acute distress. HEENT: anicteric sclera, pupils reactive to light and accommodation, EOMI CVS: S1-S2 clear, no murmur rubs or gallops Chest: clear to auscultation bilaterally, no wheezing, rales or rhonchi Abdomen: soft nontender, nondistended, normal bowel sounds, no organomegaly Extremities: no cyanosis, clubbing or edema noted bilaterally Neuro: Cranial nerves II-XII intact, no focal neurological deficits  Data Reviewed: Basic Metabolic Panel:  Recent Labs Lab 03/17/14 1525 03/18/14 0510 03/19/14 0643 03/21/14 0413 03/22/14 0458  NA 137 136 134* 132* 133*  K 4.4 4.6 4.1 3.7 3.7  CL 106 104 101 100 96  CO2 24 27 24 19 25   GLUCOSE 168* 193* 154* 148* 155*  BUN 17 22 20 19 18   CREATININE 0.81 0.91 0.80 0.71 0.65  CALCIUM 9.0 8.9 8.7 8.2* 8.2*   Liver Function Tests: No results for input(s): AST, ALT, ALKPHOS, BILITOT, PROT, ALBUMIN in the last 168 hours. No results for input(s): LIPASE, AMYLASE in the last 168 hours. No results for input(s): AMMONIA in the last 168 hours. CBC:  Recent Labs Lab 03/17/14 1525 03/18/14 0510 03/19/14 0643 03/21/14 0413 03/22/14 0458  WBC 8.1 11.8* 10.1 8.0 9.2  NEUTROABS 6.4  --   --   --   --   HGB 13.1 13.4 13.1 11.8* 12.2*  HCT 39.4 40.1 39.6 35.7* 36.4*  MCV 95.4 95.0 95.4 94.7 94.5  PLT 102* 72* 63* 75* 85*   Cardiac Enzymes: No results for input(s): CKTOTAL, CKMB, CKMBINDEX, TROPONINI in the last 168 hours. BNP (last 3 results) No results for input(s): PROBNP in the last 8760 hours. CBG: No results for input(s):  GLUCAP in the last 168 hours.  Micro Recent Results (from the past 240 hour(s))  MRSA PCR Screening     Status: None   Collection Time: 03/20/14  7:45 AM  Result Value Ref Range Status   MRSA by PCR NEGATIVE NEGATIVE Final    Comment:        The GeneXpert MRSA Assay (FDA approved for NASAL specimens only), is one component of a comprehensive MRSA colonization surveillance program. It is not intended to diagnose MRSA infection nor to guide or monitor treatment for MRSA infections.   Clostridium Difficile by PCR     Status: None   Collection Time: 03/21/14 11:43 PM  Result Value Ref Range Status   C difficile by pcr NEGATIVE NEGATIVE Final     Studies: Pelvis Portable  03/20/2014   CLINICAL DATA:  Status post bipolar hip arthroplasty.  EXAM: PORTABLE PELVIS 1-2 VIEWS  COMPARISON:  03/17/2014  FINDINGS: The patient is status post left hip bipolar arthroplasty. The hardware is in anatomic alignment and there are no complicating features identified. Specifically there is no evidence for device dislocation or periprosthetic fracture. Atherosclerotic calcifications noted.  IMPRESSION: 1. No complications after left hip arthroplasty.   Electronically Signed   By: Kerby Moors M.D.   On: 03/20/2014 16:57   Dg Chest Port 1v Same Day  03/21/2014   CLINICAL DATA:  Shortness of breath, chills  EXAM: PORTABLE CHEST - 1 VIEW SAME DAY  COMPARISON:  03/18/2014  FINDINGS: Cardiomegaly again noted. No pulmonary edema. Persistent hazy right basilar atelectasis or infiltrate. Status post median sternotomy. Atherosclerotic calcifications of thoracic aorta.  IMPRESSION: Cardiomegaly. Persistent hazy right basilar atelectasis or infiltrate. No pulmonary edema.   Electronically Signed   By: Lahoma Crocker M.D.   On: 03/21/2014 19:27    Scheduled Meds: . aspirin  81 mg Oral Daily  . atorvastatin  10 mg Oral Daily  . diltiazem  60 mg Oral BID  . docusate sodium  100 mg Oral BID  . feeding supplement (ENSURE  COMPLETE)  237 mL Oral BID BM  . ferrous sulfate  325 mg Oral BID WC  . metoprolol tartrate  25 mg Oral BID  . multivitamin-lutein  1 capsule Oral BID  . omega-3 acid ethyl esters  1 g Oral Daily  . pantoprazole  40 mg Oral Daily  . psyllium  1 packet Oral Daily  . rosuvastatin  10 mg Oral q1800   Continuous Infusions: . lactated ringers 10 mL/hr at 03/20/14 1126  . sodium chloride 0.9 % 1,000 mL with potassium chloride 10 mEq infusion         Time spent: 35 minutes    [facility] A  [facility] Pager [phone] If 7PM-7AM, please contact night-coverage at [email or web address], password [facility] 03/22/2014, 12:42 PM  LOS: 5 days

## 2014-03-22 NOTE — Clinical Social Work Note (Deleted)
CSW met with patient and provided bed offers. Patient has accepted bed offer with [facility]. CSW to make facility aware. CSW to follow tomorrow.  Lannis Lichtenwalner Patrick-[place], LCSWA Weekend Clinical Social Worker [phone] 

## 2014-03-23 ENCOUNTER — Encounter (HOSPITAL_COMMUNITY): Payer: Self-pay | Admitting: Orthopedic Surgery

## 2014-03-23 DIAGNOSIS — Z9181 History of falling: Secondary | ICD-10-CM | POA: Diagnosis not present

## 2014-03-23 DIAGNOSIS — M21252 Flexion deformity, left hip: Secondary | ICD-10-CM | POA: Diagnosis not present

## 2014-03-23 DIAGNOSIS — Z471 Aftercare following joint replacement surgery: Secondary | ICD-10-CM | POA: Diagnosis not present

## 2014-03-23 DIAGNOSIS — S72002A Fracture of unspecified part of neck of left femur, initial encounter for closed fracture: Secondary | ICD-10-CM | POA: Diagnosis not present

## 2014-03-23 DIAGNOSIS — I252 Old myocardial infarction: Secondary | ICD-10-CM | POA: Diagnosis not present

## 2014-03-23 DIAGNOSIS — I10 Essential (primary) hypertension: Secondary | ICD-10-CM | POA: Diagnosis not present

## 2014-03-23 DIAGNOSIS — R1310 Dysphagia, unspecified: Secondary | ICD-10-CM | POA: Diagnosis not present

## 2014-03-23 DIAGNOSIS — S52572D Other intraarticular fracture of lower end of left radius, subsequent encounter for closed fracture with routine healing: Secondary | ICD-10-CM | POA: Diagnosis not present

## 2014-03-23 DIAGNOSIS — M6281 Muscle weakness (generalized): Secondary | ICD-10-CM | POA: Diagnosis not present

## 2014-03-23 DIAGNOSIS — R531 Weakness: Secondary | ICD-10-CM | POA: Diagnosis not present

## 2014-03-23 DIAGNOSIS — K219 Gastro-esophageal reflux disease without esophagitis: Secondary | ICD-10-CM | POA: Diagnosis not present

## 2014-03-23 DIAGNOSIS — S60412D Abrasion of right middle finger, subsequent encounter: Secondary | ICD-10-CM | POA: Diagnosis not present

## 2014-03-23 DIAGNOSIS — S52502A Unspecified fracture of the lower end of left radius, initial encounter for closed fracture: Secondary | ICD-10-CM | POA: Diagnosis not present

## 2014-03-23 DIAGNOSIS — Z96642 Presence of left artificial hip joint: Secondary | ICD-10-CM | POA: Diagnosis not present

## 2014-03-23 DIAGNOSIS — S72002S Fracture of unspecified part of neck of left femur, sequela: Secondary | ICD-10-CM | POA: Diagnosis not present

## 2014-03-23 DIAGNOSIS — Z96649 Presence of unspecified artificial hip joint: Secondary | ICD-10-CM | POA: Diagnosis not present

## 2014-03-23 DIAGNOSIS — S5292XA Unspecified fracture of left forearm, initial encounter for closed fracture: Secondary | ICD-10-CM | POA: Diagnosis not present

## 2014-03-23 DIAGNOSIS — I4891 Unspecified atrial fibrillation: Secondary | ICD-10-CM | POA: Diagnosis not present

## 2014-03-23 DIAGNOSIS — S52502S Unspecified fracture of the lower end of left radius, sequela: Secondary | ICD-10-CM | POA: Diagnosis not present

## 2014-03-23 DIAGNOSIS — R1314 Dysphagia, pharyngoesophageal phase: Secondary | ICD-10-CM | POA: Diagnosis not present

## 2014-03-23 DIAGNOSIS — R195 Other fecal abnormalities: Secondary | ICD-10-CM | POA: Diagnosis not present

## 2014-03-23 DIAGNOSIS — E780 Pure hypercholesterolemia: Secondary | ICD-10-CM | POA: Diagnosis not present

## 2014-03-23 DIAGNOSIS — R278 Other lack of coordination: Secondary | ICD-10-CM | POA: Diagnosis not present

## 2014-03-23 DIAGNOSIS — M79644 Pain in right finger(s): Secondary | ICD-10-CM | POA: Diagnosis not present

## 2014-03-23 DIAGNOSIS — I482 Chronic atrial fibrillation: Secondary | ICD-10-CM | POA: Diagnosis not present

## 2014-03-23 DIAGNOSIS — I2510 Atherosclerotic heart disease of native coronary artery without angina pectoris: Secondary | ICD-10-CM | POA: Diagnosis not present

## 2014-03-23 DIAGNOSIS — E785 Hyperlipidemia, unspecified: Secondary | ICD-10-CM | POA: Diagnosis not present

## 2014-03-23 LAB — PROTIME-INR
INR: 1.57 — ABNORMAL HIGH (ref 0.00–1.49)
Prothrombin Time: 18.9 seconds — ABNORMAL HIGH (ref 11.6–15.2)

## 2014-03-23 MED ORDER — ENOXAPARIN SODIUM 40 MG/0.4ML ~~LOC~~ SOLN
40.0000 mg | SUBCUTANEOUS | Status: DC
  Administered 2014-03-23: 40 mg via SUBCUTANEOUS
  Filled 2014-03-23 (×2): qty 0.4

## 2014-03-23 MED ORDER — ENOXAPARIN SODIUM 40 MG/0.4ML ~~LOC~~ SOLN: 40.0000 mg | SUBCUTANEOUS | Status: DC

## 2014-03-23 MED ORDER — TRAMADOL-ACETAMINOPHEN 37.5-325 MG PO TABS: 1.0000 | ORAL_TABLET | Freq: Four times a day (QID) | ORAL | Status: DC | PRN

## 2014-03-23 MED ORDER — ASPIRIN 81 MG PO CHEW: 81.0000 mg | CHEWABLE_TABLET | Freq: Every day | ORAL | Status: AC

## 2014-03-23 NOTE — Clinical Social Work Psychosocial (Signed)
Clinical Social Work Department BRIEF PSYCHOSOCIAL ASSESSMENT 03/23/2014  Patient:  [name] Chang, [name] Chang     Account Number:  [id number]     Admit date:  03/17/2014  Clinical Social Worker:  Wylene Men  Date/Time:  03/22/2014 12:22 PM  Referred by:  Physician  Date Referred:  03/22/2014 Referred for  SNF Placement  Psychosocial assessment   Other Referral:   none   Interview type:  Other - See comment Other interview type:   patient's daughter, [name] Chang    PSYCHOSOCIAL DATA Living Status:   Admitted from facility:   Level of care:   Primary support name:  [name] Chang Primary support relationship to patient:  CHILD, ADULT Degree of support available:   adequate    CURRENT CONCERNS Current Concerns  Post-Acute Placement   Other Concerns:   none    SOCIAL WORK ASSESSMENT / PLAN CSW assessed patient at bedside.  Patient is alert and oriented and requests CSW speak with his daughter [name] Chang to make dc plans. CSW spoke with [place] who reports living with patient and being main caregiver.  PT is recommeding SNF and [name] Chang and patient are both agreeable to SNF search of [facility].  [name] Chang is hopeful that patient can return to an independent level of functioning and return home once STR complete.   Assessment/plan status:  Psychosocial Support/Ongoing Assessment of Needs Other assessment/ plan:   FL2  PASARR   Information/referral to community resources:   SNF/STR    PATIENT'S/FAMILY'S RESPONSE TO PLAN OF CARE: Patient is agreeable to SNF search of [facility].       Nonnie Done, [place] [phone]  Psychiatric & Orthopedics (5N 1-16) Clinical Social Worker

## 2014-03-23 NOTE — Progress Notes (Addendum)
Pt right forearm had a skin tear when NT Sunday Spillers pulled out the IV catheter and got tape away from him. Recording to [place], she did gently but pt skin is so fragile to tear. Pt has dry , fragile skin and has multiple busies on hid body( see chart ).patient daughter is aware on it.  Hand off t gave to RN, Meryl Dare at [facility]. She is informed about pt skin condition as well. PT home cloth is on and ready for discharge.

## 2014-03-23 NOTE — Evaluation (Signed)
Occupational Therapy Evaluation Patient Details Name: [name] Chang MRN: [id number] DOB: 1915-06-13 Today's Date: 03/23/2014    History of Present Illness 81 yo male who sustained a fall and now has received hemiarthroplasty to L hip and L distal radial fracture with  splint awaiting casting.    Clinical Impression   Pt is currently requiring increased assistance (max - +2) w/ all ADL's and functional mobility however was completely I (cooks, drives, IADL/IADL's) prior to this fall and multiple injuries. He will benefit from acute skilled OT to assist with maximizing independence with ADL's, functional mobility and transfers and UE rehab as able /fracture healing. Recommend CIR assessment.    Follow Up Recommendations  CIR;Supervision/Assistance - 24 hour    Equipment Recommendations  Other (comment) (Defer to next venue)    Recommendations for Other Services Rehab consult (Pt lived alone, drives, cooks, I ADL's, IADL's prior to this injury)     Precautions / Restrictions Precautions Precautions: Fall;Posterior Hip Required Braces or Orthoses: Sling Restrictions Weight Bearing Restrictions: Yes LUE Weight Bearing: Weight bear through elbow only LLE Weight Bearing: Weight bearing as tolerated Other Position/Activity Restrictions: clarification received to wb through platform per MD and platform walker in room. RUE MF with splint - no AROM      Mobility Bed Mobility Overal bed mobility: Needs Assistance;+2 for physical assistance;+ 2 for safety/equipment Bed Mobility: Supine to Sit     Supine to sit: Max assist     General bed mobility comments: (A) to bring LEs iand trunk from supine to sit EOB position and guard Lt UE; cues for NWB through Lt wrist/hand - elbow only  Transfers Overall transfer level: Needs assistance Equipment used: 2 person hand held assist (Declined use of platform RW, eleceted for 1 person SPT w/ gait belt after sit to stand. Recommend 2+ assist for  transfers at this time) Transfers: Sit to/from [facility] Sit to Stand: +2 physical assistance;Max assist Stand pivot transfers: Max assist;+2 physical assistance       General transfer comment: Moves slowly, requires vc's for safety and sequencing.    Balance Overall balance assessment: Needs assistance Sitting-balance support: Bilateral upper extremity supported;Feet supported Sitting balance-Leahy Scale: Poor Sitting balance - Comments: guarded Postural control: Posterior lean   Standing balance-Leahy Scale: Poor Standing balance comment: +2 assist                            ADL Overall ADL's : Needs assistance/impaired Eating/Feeding: Set up;Maximal assistance Eating/Feeding Details (indicate cue type and reason): Pt unable to grasp/grip w/ R dominant hand and daughter has been feeding pt. LUE NWB wrist and hand. Can WB through elbow only for transfers/platform RW Grooming: Wash/dry hands;Wash/dry face;Oral care;Brushing hair;Sitting;Maximal assistance   Upper Body Bathing: Maximal assistance;Sitting;Adhering to UE precautions   Lower Body Bathing: Total assistance;+2 for physical assistance;Sit to/from stand;Cueing for sequencing   Upper Body Dressing : Sitting;Maximal assistance   Lower Body Dressing: Maximal assistance;Total assistance;+2 for physical assistance;Sit to/from stand   Toilet Transfer: Maximal assistance;+2 for physical assistance;Stand-pivot;BSC Toilet Transfer Details (indicate cue type and reason): Has platform RW in room, preferred SPT from EOB to chair for simulated transfer [place] and Hygiene: +2 for physical assistance;Maximal assistance;Sit to/from stand       Functional mobility during ADLs: +2 for physical assistance;Maximal assistance;Total assistance;Rolling walker (Platform RW, NWB LUE wrist/hand, can WB through elbow only. R MF splint, no AROM) General ADL Comments: Pt  is ucrrently  requiring increased assistance w/ all ADL's and functional mobility however was completely I (cooks, drives, IADL/IADL's) prior to this fall and multiple injuries. He will benefit from acute skilled OT to assist with maximizing independence with ADL's, functional mobility and transfers and UE rehab as able /fracture healing. Pt was educated in role of OT and participated in functional mobility and transfer training today as well as bed mobility.     Vision  Wears glasses at all times                   Perception     Praxis      Pertinent Vitals/Pain Pain Assessment: Faces Pain Score: 0-No pain Faces Pain Scale: Hurts a little bit (My toes are cold and I feel tired)     Hand Dominance Right   Extremity/Trunk Assessment Upper Extremity Assessment Upper Extremity Assessment: LUE deficits/detail;RUE deficits/detail RUE Deficits / Details: Right MF w/ Splint, no AROM. Pt's daughter has been feeding pt as he is unable to grasp/grip LUE Deficits / Details: NWB through left wrist and hand but can WB on elbow   Lower Extremity Assessment Lower Extremity Assessment: Defer to PT evaluation   Cervical / Trunk Assessment Cervical / Trunk Assessment: Normal   Communication Communication Communication: HOH (Hears better out of left ear)   Cognition                                  Shoulder Instructions      Home Living Family/patient expects to be discharged to:: Skilled nursing facility Living Arrangements: Children Available Help at Discharge: Family;Available 24 hours/day Type of Home: House Home Access: Stairs to enter [employer] of Steps: 1-2                   Home Equipment: Cane - single point   Additional Comments: Pt lives with daughter who is disabled       Prior Functioning/Environment Level of Independence: Independent with assistive device(s)        Comments: Wife reports he was using cane and due to knee OA walked very  slowly    OT Diagnosis: Generalized weakness;Acute pain   OT Problem List: Decreased strength;Decreased range of motion;Decreased activity tolerance;Impaired balance (sitting and/or standing);Decreased knowledge of precautions;Decreased knowledge of use of DME or AE;Pain;Impaired UE functional use   OT Treatment/Interventions: Self-care/ADL training;Therapeutic exercise;Energy conservation;DME and/or AE instruction;Patient/family education;Therapeutic activities;Balance training    OT Goals(Current goals can be found in the care plan section) Acute Rehab OT Goals Patient Stated Goal: Return to independence Time For Goal Achievement: 04/06/14 Potential to Achieve Goals: Good  OT Frequency: Min 2X/week   Barriers to D/C:            Co-evaluation              End of Session Equipment Utilized During Treatment: Gait belt;Oxygen Nurse Communication: Mobility status;Other (comment) (Pt requesting drink that family brought in from home)  Activity Tolerance: Patient tolerated treatment well Patient left: in chair;with call bell/phone within reach;with family/visitor present   Time: 6578-4696 OT Time Calculation (min): 38 min Charges:  OT General Charges $OT Visit: 1 Procedure OT Evaluation $Initial OT Evaluation Tier I: 1 Procedure OT Treatments $Therapeutic Activity: 23-37 mins (23 min) G-Codes:    Darrick Greenlaw Beth Dixon, OTR/L 03/23/2014, 9:02 AM

## 2014-03-23 NOTE — Progress Notes (Signed)
Patient ID: [name] Chang, male   DOB: [birth year], 81 y.o.   MRN: [id number] Subjective: 3 Days Post-Op Procedure(s) (LRB): ARTHROPLASTY BIPOLAR HIP (Left)    Patient reports pain as mild with regards to his hip pain.  Reports elbow pain.  Daughter in room. Wants to sit up to eat breakfast  Objective:   VITALS:   Filed Vitals:   03/23/14 0603  BP: 126/55  Pulse: 82  Temp: 98.2 F (36.8 C)  Resp: 18    Neurovascular intact Incision: dressing C/D/I  LABS  Recent Labs  03/21/14 0413 03/22/14 0458  HGB 11.8* 12.2*  HCT 35.7* 36.4*  WBC 8.0 9.2  PLT 75* 85*     Recent Labs  03/21/14 0413 03/22/14 0458  NA 132* 133*  K 3.7 3.7  BUN 19 18  CREATININE 0.71 0.65  GLUCOSE 148* 155*     Recent Labs  03/22/14 0458 03/23/14 0500  INR 1.37 1.57*     Assessment/Plan: 3 Days Post-Op Procedure(s) (LRB): ARTHROPLASTY BIPOLAR HIP (Left)   Up with therapy  To SNF when arranged [facility] RTC in 2 weeks

## 2014-03-23 NOTE — Discharge Instructions (Signed)
WBAT LLE with regards to hip procedure NWB LUE due to left distal radius fracture - use elevated arm rest for walker  May shower at anytime Maintain left hip dressing as this is water proof and will remain in place until follow up visit

## 2014-03-23 NOTE — Discharge Planning (Signed)
Patient to dc today per MD order. Patient to dc today to [facility]: [facility]- awaiting SNF to complete admission's paperwork at bedside.  DC plans reviewed with daughter and patient. Both are agreeable.  RN updated.  Nonnie Done, [place] [phone]  Psychiatric & Orthopedics (5N 1-16) Clinical Social Worker

## 2014-03-23 NOTE — Discharge Summary (Signed)
Physician Discharge Summary  [name] Chang TJQ:[id number] DOB: November 15, 1915 DOA: 03/17/2014  PCP: No primary care provider on file.  Admit date: 03/17/2014 Discharge date: 03/23/2014  Time spent: 40 minutes  Recommendations for Outpatient Follow-up:  1. Follow-up with Dr. Alvan Dame in 2 weeks. 2. Continue subcutaneous Lovenox for postoperative DVT prophylaxis for 3 more weeks.  Discharge Diagnoses:  Active Problems:   Essential hypertension   Coronary atherosclerosis   Atrial fibrillation   Left displaced femoral neck fracture   Distal radius fracture, left   Hyperlipidemia   Closed left hip fracture   Discharge Condition: Stable  Diet recommendation: Heart healthy  There were no vitals filed for this visit.  History of present illness:  [name] Chang is a 81 y.o. male presents with a hip fracture. Patient staes he was getting into his car and didn't raise his foot high enough to avoid a uneven area in the cement. Patient states he landed on his left side with his arm extended. Patient states he hit his head also. Patient did not lose conscious ness. He was not dizzy or light headed. Patient states that right now he is having pain in his left wrist and also his left hip. On evalaution in the ED he has a NON-DISPLACED Femoral neck fracture. Patient also has a radial fracture on the left side and has some abrasions on the left side of his skull.  Hospital Course:    Left NON-displaced fracture of the femoral neck -Admitted after fall, with left radial fracture and left subcapital impacted femoral neck fracture. -CT scan showed impacted subcapital femoral neck fracture. -Seen by orthopedics, initially recommended ambulation if he tolerates that; no surgery. -Was not able to ambulate with PT. -S/P arthroplasty done by Dr. Alvan Dame on 1/22. -Patient will be discharged to nursing home DVT prophylaxis with Lovenox for 3 weeks.  Left Fracture of Radius -Splinted in the ED -Control pain with  narcotics  Atrial Fibrillation -Currently rate controlled -Continue with home medications -Was on Coumadin for anticoagulation, with recent falls and thrombocytopenia I discontinued his Coumadin. -Patient started on aspirin for thromboembolic event prophylaxis  CAD -stable at this time  Hypertension -monitor pressures -will continue with home medications  Thrombocytopenia -Appears to be chronic, monitor infrequently.  Chronic Anticoagulation -On Coumadin, Coumadin discontinued -Patient demonstrated high risk for falls along with his advanced age I think he should be better off of Coumadin. -Started on low-dose aspirin for his atrial fibrillation.   Procedures:  Casting of left forearm done in the ED.  Left hip hemiarthroplasty done by Dr. Alvan Dame on 03/20/2014.  Consultations:  Ortho  Discharge Exam: Filed Vitals:   03/23/14 0603  BP: 126/55  Pulse: 82  Temp: 98.2 F (36.8 C)  Resp: 18   General: Alert and awake, oriented x3, not in any acute distress. HEENT: anicteric sclera, pupils reactive to light and accommodation, EOMI CVS: S1-S2 clear, no murmur rubs or gallops Chest: clear to auscultation bilaterally, no wheezing, rales or rhonchi Abdomen: soft nontender, nondistended, normal bowel sounds, no organomegaly Extremities: no cyanosis, clubbing or edema noted bilaterally Neuro: Cranial nerves II-XII intact, no focal neurological deficits  Discharge Instructions   Discharge Instructions    Diet - low sodium heart healthy    Complete by:  As directed      Increase activity slowly    Complete by:  As directed      Weight bearing as tolerated    Complete by:  As directed  Current Discharge Medication List    START taking these medications   Details  aspirin 81 MG chewable tablet Chew 1 tablet (81 mg total) by mouth daily.    enoxaparin (LOVENOX) 40 MG/0.4ML injection Inject 0.4 mLs (40 mg total) into the skin daily. Qty: 21 Syringe, Refills:  0    traMADol-acetaminophen (ULTRACET) 37.5-325 MG per tablet Take 1 tablet by mouth every 6 (six) hours as needed. Qty: 60 tablet, Refills: 0      CONTINUE these medications which have NOT CHANGED   Details  atorvastatin (LIPITOR) 10 MG tablet Take 10 mg by mouth daily.    CHONDROITIN SULFATE PO Take 1 tablet by mouth 2 (two) times daily.     diltiazem (CARDIZEM) 60 MG tablet Take 60 mg by mouth 2 (two) times daily.     Glucosamine 750 MG TABS Take 750 mg by mouth 3 (three) times daily.    metoprolol (LOPRESSOR) 50 MG tablet Take 25 mg by mouth 2 (two) times daily.    Multiple Vitamin (MULTIVITAMIN WITH MINERALS) TABS tablet Take 1 tablet by mouth daily.    Multiple Vitamins-Minerals (PRESERVISION AREDS 2) CAPS Take 1 tablet by mouth 2 (two) times daily.    Omega 3 1200 MG CAPS Take 1,200 mg by mouth daily.    omeprazole (PRILOSEC OTC) 20 MG tablet Take 20 mg by mouth daily.    psyllium (METAMUCIL) 58.6 % packet Take 1 packet by mouth daily.      STOP taking these medications     warfarin (COUMADIN) 5 MG tablet        No Known Allergies Follow-up Information    Follow up with Mauri Pole, MD In 2 weeks.   Specialty:  Orthopedic Surgery   Why:  For wound check   Contact information:   [address] [place] [address] [phone]        The results of significant diagnostics from this hospitalization (including imaging, microbiology, ancillary and laboratory) are listed below for reference.    Significant Diagnostic Studies: Dg Chest 2 View  03/17/2014   CLINICAL DATA:  [place] today with chest pain  EXAM: CHEST  2 VIEW  COMPARISON:  08/23/2009  FINDINGS: Cardiac shadow remains enlarged. Postsurgical changes are again noted. Mild interstitial changes are seen without focal infiltrate or sizable effusion. No acute bony abnormality is noted.  IMPRESSION: No acute abnormality seen.   Electronically Signed   By: Inez Catalina M.D.   On: 03/17/2014  17:11   Dg Forearm Left  03/17/2014   CLINICAL DATA:  Wrist and distal forearm pain after falling.  EXAM: LEFT FOREARM - 2 VIEW  COMPARISON:  None.  FINDINGS: The bones are demineralized. There is a mildly impacted intra-articular fracture of the distal radius. There is a nondisplaced ulnar styloid fracture. No proximal injuries are identified. The alignment appears normal at the elbow. There is scapholunate diastasis. Scattered vascular calcifications are noted.  IMPRESSION: Impacted intra-articular fracture of the distal radius with nondisplaced ulnar styloid fracture.   Electronically Signed   By: Camie Patience M.D.   On: 03/17/2014 17:12   Dg Wrist Complete Left  03/17/2014   CLINICAL DATA:  Status post fall today with severe left wrist pain. Initial encounter.  EXAM: LEFT WRIST - COMPLETE 3+ VIEW  COMPARISON:  None.  FINDINGS: The patient has an acute and mildly impacted distal radius fracture with intra-articular extension. No other fracture is identified. There is marked soft tissue swelling about the wrist. First  CMC and scaphoid trapezium trapezoid joint osteoarthritis is noted.  IMPRESSION: Mildly impacted distal radius fracture with intra-articular involvement and associated soft tissue swelling.   Electronically Signed   By: Inge Rise M.D.   On: 03/17/2014 17:12   Dg Ankle Complete Left  03/17/2014   CLINICAL DATA:  Left ankle pain following fall, initial encounter  EXAM: LEFT ANKLE COMPLETE - 3+ VIEW  COMPARISON:  None.  FINDINGS: Mild soft tissue swelling is noted laterally. No acute fracture or dislocation is seen. Diffuse vascular calcifications are noted.  IMPRESSION: Soft tissue swelling without acute bony abnormality.   Electronically Signed   By: Inez Catalina M.D.   On: 03/17/2014 17:18   Ct Head Wo Contrast  03/17/2014   CLINICAL DATA:  Status post fall with a blow to the head.  EXAM: CT HEAD WITHOUT CONTRAST  CT CERVICAL SPINE WITHOUT CONTRAST  TECHNIQUE: Multidetector CT  imaging of the head and cervical spine was performed following the standard protocol without intravenous contrast. Multiplanar CT image reconstructions of the cervical spine were also generated.  COMPARISON:  Chest CT scan 08/03/2006.  FINDINGS: CT HEAD FINDINGS  Contusion is seen over the left frontal bone without underlying fracture. The brain is atrophic with chronic microvascular ischemic change. No evidence of acute intracranial abnormality including infarct, hemorrhage, mass lesion, mass effect, midline shift or abnormal extra-axial fluid collection. No hydrocephalus or pneumocephalus. Imaged paranasal sinuses and mastoid air cells are clear.  CT CERVICAL SPINE FINDINGS  There is no fracture or malalignment of the cervical spine. Mild cervical spondylosis predominantly involves the facet joints. Lung apices demonstrate some scarring on the left, unchanged, and scattered ground-glass attenuation.  IMPRESSION: Hematoma over the left frontal bone without underlying fracture or acute intracranial abnormality.  No acute abnormality cervical spine.   Electronically Signed   By: Inge Rise M.D.   On: 03/17/2014 16:10   Ct Cervical Spine Wo Contrast  03/17/2014   CLINICAL DATA:  Status post fall with a blow to the head.  EXAM: CT HEAD WITHOUT CONTRAST  CT CERVICAL SPINE WITHOUT CONTRAST  TECHNIQUE: Multidetector CT imaging of the head and cervical spine was performed following the standard protocol without intravenous contrast. Multiplanar CT image reconstructions of the cervical spine were also generated.  COMPARISON:  Chest CT scan 08/03/2006.  FINDINGS: CT HEAD FINDINGS  Contusion is seen over the left frontal bone without underlying fracture. The brain is atrophic with chronic microvascular ischemic change. No evidence of acute intracranial abnormality including infarct, hemorrhage, mass lesion, mass effect, midline shift or abnormal extra-axial fluid collection. No hydrocephalus or pneumocephalus. Imaged  paranasal sinuses and mastoid air cells are clear.  CT CERVICAL SPINE FINDINGS  There is no fracture or malalignment of the cervical spine. Mild cervical spondylosis predominantly involves the facet joints. Lung apices demonstrate some scarring on the left, unchanged, and scattered ground-glass attenuation.  IMPRESSION: Hematoma over the left frontal bone without underlying fracture or acute intracranial abnormality.  No acute abnormality cervical spine.   Electronically Signed   By: Inge Rise M.D.   On: 03/17/2014 16:10   Pelvis Portable  03/20/2014   CLINICAL DATA:  Status post bipolar hip arthroplasty.  EXAM: PORTABLE PELVIS 1-2 VIEWS  COMPARISON:  03/17/2014  FINDINGS: The patient is status post left hip bipolar arthroplasty. The hardware is in anatomic alignment and there are no complicating features identified. Specifically there is no evidence for device dislocation or periprosthetic fracture. Atherosclerotic calcifications noted.  IMPRESSION: 1. No complications  after left hip arthroplasty.   Electronically Signed   By: Kerby Moors M.D.   On: 03/20/2014 16:57   Ct Hip Left Wo Contrast  03/17/2014   CLINICAL DATA:  Patient fell today. Questionable left hip fracture on radiographs.  EXAM: CT OF THE LEFT HIP WITHOUT CONTRAST  TECHNIQUE: Multidetector CT imaging of the left hip was performed according to the standard protocol. Multiplanar CT image reconstructions were also generated.  COMPARISON:  Current left hip radiographs  FINDINGS: Subcapital fracture of the left femoral neck. 3 shows slight impaction and apex anterior angulation. No significant comminution.  No other fractures.  Left hip joint normally aligned.  Mild subcutaneous edema lateral to the greater trochanter.  No soft tissue hematoma.  There are dense vascular calcifications.  Bones are diffusely demineralized.  IMPRESSION: 1. Subcapital left femoral neck fracture without significant displacement. There is slight impaction and  apex anterior angulation. No comminution.   Electronically Signed   By: Lajean Manes M.D.   On: 03/17/2014 21:10   Chest Portable 1 View  03/18/2014   CLINICAL DATA:  Recent fall with multiple wound fractures  EXAM: PORTABLE CHEST - 1 VIEW  COMPARISON:  03/17/14  FINDINGS: Cardiomegaly is again noted. Tortuosity of the thoracic aorta with calcification is seen. Increased vascular congestion is noted as well as some bibasilar atelectatic changes. No sizable effusion is noted. No acute bony abnormality is seen.  IMPRESSION: Increasing vascular congestion with bibasilar atelectatic changes.   Electronically Signed   By: Inez Catalina M.D.   On: 03/18/2014 08:07   Dg Chest Port 1v Same Day  03/21/2014   CLINICAL DATA:  Shortness of breath, chills  EXAM: PORTABLE CHEST - 1 VIEW SAME DAY  COMPARISON:  03/18/2014  FINDINGS: Cardiomegaly again noted. No pulmonary edema. Persistent hazy right basilar atelectasis or infiltrate. Status post median sternotomy. Atherosclerotic calcifications of thoracic aorta.  IMPRESSION: Cardiomegaly. Persistent hazy right basilar atelectasis or infiltrate. No pulmonary edema.   Electronically Signed   By: Lahoma Crocker M.D.   On: 03/21/2014 19:27   Dg Knee Complete 4 Views Left  03/17/2014   CLINICAL DATA:  Fall, severe pain  EXAM: LEFT KNEE - COMPLETE 4+ VIEW  COMPARISON:  None.  FINDINGS: Four views of the left knee submitted. No acute fracture or subluxation. There is diffuse osteopenia. Significant narrowing of medial joint compartment. Mild chondrocalcinosis. Mild spurring of medial femoral condyle and medial tibial plateau. Narrowing of patellofemoral joint space. No joint effusion. Atherosclerotic calcifications of femoral and popliteal artery.  IMPRESSION: No acute fracture or subluxation. Diffuse osteopenia. Osteoarthritic changes as described above. Mild chondrocalcinosis.   Electronically Signed   By: Lahoma Crocker M.D.   On: 03/17/2014 17:13   Dg Humerus Left  03/17/2014    CLINICAL DATA:  Recent fall with left arm pain  EXAM: LEFT HUMERUS - 2+ VIEW  COMPARISON:  None.  FINDINGS: The humerus is well visualized and within normal limits. Degenerative changes of the acromioclavicular joint are seen. No gross soft tissue abnormality is noted.  IMPRESSION: No acute abnormality seen.   Electronically Signed   By: Inez Catalina M.D.   On: 03/17/2014 17:12   Dg Hand Complete Right  03/17/2014   CLINICAL DATA:  Pain post fall  EXAM: RIGHT HAND - COMPLETE 3+ VIEW  COMPARISON:  None.  FINDINGS: Three views of the right hand submitted. No acute fracture or subluxation. There is narrowing of radiocarpal joint space. Diffuse osteopenia. Mild chondrocalcinosis. Degenerative changes first  carpometacarpal joint. Degenerative changes anterior aspect of scaphoid. Degenerative changes proximal and distal interphalangeal joints.  IMPRESSION: No acute fracture or subluxation. Diffuse osteopenia. Degenerative changes as described above.   Electronically Signed   By: Lahoma Crocker M.D.   On: 03/17/2014 17:15   Dg Hip Unilat With Pelvis 2-3 Views Left  03/17/2014   CLINICAL DATA:  Status post fall. Left hip pain with limited range of motion. Initial encounter.  EXAM: DG HIP W/ PELVIS 2-3V*L*  COMPARISON:  None.  FINDINGS: The bones are demineralized. There is deformity of the left femoral neck suspicious for a fracture. This is not definitive and could be related to an old injury. There is no evidence of pelvic fracture or dislocation. Mild degenerative changes are present at the hips and sacroiliac joints. There are diffuse vascular calcifications. Postsurgical changes are noted related to left inguinal hernia repair.  IMPRESSION: Left femoral neck deformity suspicious for a nondisplaced fracture, age indeterminate. CT suggested for further evaluation.   Electronically Signed   By: Camie Patience M.D.   On: 03/17/2014 17:15    Microbiology: Recent Results (from the past 240 hour(s))  MRSA PCR Screening      Status: None   Collection Time: 03/20/14  7:45 AM  Result Value Ref Range Status   MRSA by PCR NEGATIVE NEGATIVE Final    Comment:        The GeneXpert MRSA Assay (FDA approved for NASAL specimens only), is one component of a comprehensive MRSA colonization surveillance program. It is not intended to diagnose MRSA infection nor to guide or monitor treatment for MRSA infections.   Clostridium Difficile by PCR     Status: None   Collection Time: 03/21/14 11:43 PM  Result Value Ref Range Status   C difficile by pcr NEGATIVE NEGATIVE Final     Labs: Basic Metabolic Panel:  Recent Labs Lab 03/17/14 1525 03/18/14 0510 03/19/14 0643 03/21/14 0413 03/22/14 0458  NA 137 136 134* 132* 133*  K 4.4 4.6 4.1 3.7 3.7  CL 106 104 101 100 96  CO2 24 27 24 19 25   GLUCOSE 168* 193* 154* 148* 155*  BUN 17 22 20 19 18   CREATININE 0.81 0.91 0.80 0.71 0.65  CALCIUM 9.0 8.9 8.7 8.2* 8.2*   Liver Function Tests: No results for input(s): AST, ALT, ALKPHOS, BILITOT, PROT, ALBUMIN in the last 168 hours. No results for input(s): LIPASE, AMYLASE in the last 168 hours. No results for input(s): AMMONIA in the last 168 hours. CBC:  Recent Labs Lab 03/17/14 1525 03/18/14 0510 03/19/14 0643 03/21/14 0413 03/22/14 0458  WBC 8.1 11.8* 10.1 8.0 9.2  NEUTROABS 6.4  --   --   --   --   HGB 13.1 13.4 13.1 11.8* 12.2*  HCT 39.4 40.1 39.6 35.7* 36.4*  MCV 95.4 95.0 95.4 94.7 94.5  PLT 102* 72* 63* 75* 85*   Cardiac Enzymes: No results for input(s): CKTOTAL, CKMB, CKMBINDEX, TROPONINI in the last 168 hours. BNP: BNP (last 3 results) No results for input(s): PROBNP in the last 8760 hours. CBG: No results for input(s): GLUCAP in the last 168 hours.     Signed:  Phil Michels A  [facility] 03/23/2014, 11:33 AM

## 2014-03-23 NOTE — Progress Notes (Signed)
We received screening request for possible inpatient rehab and I noted that pt is s/p hemiarthroplasty to L hip and L distal radial fx. Pt has [employer] and I spoke with [employer]. [employer] will not give authorization for inpatient rehab based on pt's current diagnosis but will give approval for skilled nursing.  We recommend pursuing SNF at this time. Thanks.  Nanetta Batty, PT Rehabilitation Admissions Coordinator [phone]

## 2014-03-23 NOTE — Progress Notes (Signed)
Physical Therapy Treatment Patient Details Name: [name] Chang MRN: [id number] DOB: 05/13/1915 Today's Date: 03/23/2014    History of Present Illness 81 yo male who sustained a fall and now has received hemiarthroplasty to L hip and L distal radial fracture with  splint awaiting casting.     PT Comments    Pt slowly progressing towards goals. Pt did attempt to ambulate this date with maxAx2 and platform walker. Pt fatigues quickly due to maximal effort to complete transfers and ambulation. Pt motivated and desires to return home.   Follow Up Recommendations  SNF;Supervision/Assistance - 24 hour     Equipment Recommendations   (bilat platform walker)    Recommendations for Other Services       Precautions / Restrictions Precautions Precautions: Fall;Posterior Hip Precaution Booklet Issued: Yes (comment) Precaution Comments: pt unable to recall, pt re-educated Required Braces or Orthoses: Sling Restrictions Weight Bearing Restrictions: Yes LUE Weight Bearing: Weight bear through elbow only LLE Weight Bearing: Weight bearing as tolerated    Mobility  Bed Mobility               General bed mobility comments: pt on BSC   Transfers Overall transfer level: Needs assistance Equipment used:  (2 person lift with gait belt up to platform walker) Transfers: Sit to/from [employer] Transfers Sit to Stand: +2 physical assistance;Max assist Stand pivot transfers: Max assist;+2 physical assistance       General transfer comment: max A to achieve full upright posture and to assist with mangement of L UE. max directional v/c's for sequencing steps to chair during std pvt. maxA to control descent into chair  Ambulation/Gait Ambulation/Gait assistance: Max assist;+2 physical assistance Ambulation Distance (Feet): 5 Feet Assistive device: Bilateral platform walker Gait Pattern/deviations: Step-to pattern;Decreased stride length;Trunk flexed Gait velocity: decreased Gait  velocity interpretation: Below normal speed for age/gender General Gait Details: max directional v/c's for sequencing, maxA to maintain upright position   Stairs            Wheelchair Mobility    Modified Rankin (Stroke Patients Only)       Balance Overall balance assessment: Needs assistance         Standing balance support: During functional activity Standing balance-Leahy Scale: Poor Standing balance comment: maxA x 2 to maintain standing for hygiene s/p tolieting                    Cognition Arousal/Alertness: Awake/alert Behavior During Therapy: WFL for tasks assessed/performed Overall Cognitive Status: Within Functional Limits for tasks assessed                      Exercises      General Comments        Pertinent Vitals/Pain Pain Assessment: Faces Faces Pain Scale: Hurts even more Pain Location: L hip during ambualtion Pain Descriptors / Indicators: Aching Pain Intervention(s): Monitored during session    Home Living                      Prior Function            PT Goals (current goals can now be found in the care plan section) Acute Rehab PT Goals Patient Stated Goal: get to walking Progress towards PT goals: Progressing toward goals    Frequency  Min 3X/week    PT Plan Current plan remains appropriate    Co-evaluation  End of Session Equipment Utilized During Treatment: Gait belt Activity Tolerance: Patient limited by pain;Patient limited by fatigue Patient left: in chair;with call bell/phone within reach;with family/visitor present     Time: 1275-1700 PT Time Calculation (min) (ACUTE ONLY): 24 min  Charges:  $Gait Training: 8-22 mins $Therapeutic Activity: 8-22 mins                    G Codes:      Kingsley Callander 03/23/2014, 1:58 PM   Kittie Plater, PT, DPT Pager #: [phone] Office #: [phone]

## 2014-03-23 NOTE — Clinical Social Work Placement (Signed)
Clinical Social Work Department CLINICAL SOCIAL WORK PLACEMENT NOTE 03/23/2014  Patient:  [name] Chang, [name] Chang  Account Number:  [id number] Admit date:  03/17/2014  Clinical Social Worker:  Wylene Men  Date/time:  03/20/2014 12:30 PM  Clinical Social Work is seeking post-discharge placement for this patient at the following level of care:   [place]   (*CSW will update this form in Epic as items are completed)   03/20/2014  Patient/family provided with [facility] Department of Clinical Social Work's list of facilities offering this level of care within the geographic area requested by the patient (or if unable, by the patient's family).  03/20/2014  Patient/family informed of their freedom to choose among providers that offer the needed level of care, that participate in Medicare, Medicaid or managed care program needed by the patient, have an available bed and are willing to accept the patient.  03/20/2014  Patient/family informed of [facility]' ownership interest in [facility], as well as of the fact that they are under no obligation to receive care at this facility.  PASARR submitted to EDS on 03/20/2014 PASARR number received on 03/20/2014  FL2 transmitted to all facilities in geographic area requested by pt/family on  03/20/2014 FL2 transmitted to all facilities within larger geographic area on 03/20/2014  Patient informed that his/her managed care company has contracts with or will negotiate with  certain facilities, including the following:     Patient/family informed of bed offers received:  03/23/2014 Patient chooses bed at [place] Physician recommends and patient chooses bed at    Patient to be transferred to [place] on  03/23/2014 Patient to be transferred to facility by [place] Patient and family notified of transfer on 03/23/2014 Name of family member notified:  [name] daughter  The following physician request were entered in  Epic:   Additional Comments:  Nonnie Done, [place] [phone]  Psychiatric & Orthopedics (5N 1-16) Clinical Social Worker

## 2014-03-24 ENCOUNTER — Encounter: Payer: Self-pay | Admitting: Adult Health

## 2014-03-24 ENCOUNTER — Non-Acute Institutional Stay (SKILLED_NURSING_FACILITY): Payer: Medicare Other | Admitting: Adult Health

## 2014-03-24 DIAGNOSIS — S52502S Unspecified fracture of the lower end of left radius, sequela: Secondary | ICD-10-CM | POA: Diagnosis not present

## 2014-03-24 DIAGNOSIS — I10 Essential (primary) hypertension: Secondary | ICD-10-CM

## 2014-03-24 DIAGNOSIS — S72002S Fracture of unspecified part of neck of left femur, sequela: Secondary | ICD-10-CM

## 2014-03-24 DIAGNOSIS — E785 Hyperlipidemia, unspecified: Secondary | ICD-10-CM

## 2014-03-24 DIAGNOSIS — K219 Gastro-esophageal reflux disease without esophagitis: Secondary | ICD-10-CM

## 2014-03-24 DIAGNOSIS — I2510 Atherosclerotic heart disease of native coronary artery without angina pectoris: Secondary | ICD-10-CM | POA: Diagnosis not present

## 2014-03-24 DIAGNOSIS — I482 Chronic atrial fibrillation, unspecified: Secondary | ICD-10-CM

## 2014-03-24 DIAGNOSIS — D696 Thrombocytopenia, unspecified: Secondary | ICD-10-CM

## 2014-03-24 NOTE — Progress Notes (Signed)
Patient ID: [name] Chang, male   DOB: 01/25/1916, 81 y.o.   MRN: [id number]   03/24/2014  Facility:  [facility] Home Location:  [place] Room Number: 705-P LEVEL OF CARE:  SNF (31)   Chief Complaint  Patient presents with  . Hospitalization Follow-up    Left femoral neck fracture S/P arthroplasty, left radius fracture, CAD, hypertension, thrombocytopenia, hyperlipidemia, atrial fibrillation and GERD    HISTORY OF PRESENT ILLNESS:  This is a 81 year old male who has been admitted to [facility] on 03/23/14 from [facility]. He fell while getting into his car. He landed on his left side and keep his head. He sustained a left nondisplaced fracture of femoral neck and left radius fracture. He had left hip arthroplasty and left radius was splinted. He has PMH of CAD, hypertension, atrial fibrillation and hyperlipidemia. He has been admitted for a short-term rehabilitation.  PAST MEDICAL HISTORY:  Past Medical History  Diagnosis Date  . Hypertension   . High cholesterol   . Heart trouble   . Osteoarthritis   . Bone cancer   . Bilateral swelling of feet     CURRENT MEDICATIONS: Reviewed per MAR/see medication list  No Known Allergies   REVIEW OF SYSTEMS:  GENERAL: no change in appetite, no fatigue, no weight changes, no fever, chills or weakness RESPIRATORY: no cough, SOB, DOE, wheezing, hemoptysis CARDIAC: no chest pain, edema or palpitations GI: no abdominal pain, diarrhea, constipation, heart burn, nausea or vomiting  PHYSICAL EXAMINATION  GENERAL: no acute distress, normal body habitus SKIN:  Left forehead and left orbital bruising EYES: conjunctivae normal, sclerae normal, normal eye lids NECK: supple, trachea midline, no neck masses, no thyroid tenderness, no thyromegaly LYMPHATICS: no LAN in the neck, no supraclavicular LAN RESPIRATORY: breathing is even & unlabored, BS CTAB CARDIAC: RRR, no murmur,no extra heart sounds, no  edema GI: abdomen soft, normal BS, no masses, no tenderness, no hepatomegaly, no splenomegaly EXTREMITIES: He is able to move 4 extremities; left forearm has a splint and on a sling; left middle finger has splint PSYCHIATRIC: the patient is alert & oriented to person, affect & behavior appropriate  LABS/RADIOLOGY: Labs reviewed: Basic Metabolic Panel:  Recent Labs  03/19/14 0643 03/21/14 0413 03/22/14 0458  NA 134* 132* 133*  K 4.1 3.7 3.7  CL 101 100 96  CO2 24 19 25   GLUCOSE 154* 148* 155*  BUN 20 19 18   CREATININE 0.80 0.71 0.65  CALCIUM 8.7 8.2* 8.2*   CBC:  Recent Labs  03/17/14 1525  03/19/14 0643 03/21/14 0413 03/22/14 0458  WBC 8.1  < > 10.1 8.0 9.2  NEUTROABS 6.4  --   --   --   --   HGB 13.1  < > 13.1 11.8* 12.2*  HCT 39.4  < > 39.6 35.7* 36.4*  MCV 95.4  < > 95.4 94.7 94.5  PLT 102*  < > 63* 75* 85*  < > = values in this interval not displayed.  Dg Chest 2 View  03/17/2014   CLINICAL DATA:  [place] today with chest pain  EXAM: CHEST  2 VIEW  COMPARISON:  08/23/2009  FINDINGS: Cardiac shadow remains enlarged. Postsurgical changes are again noted. Mild interstitial changes are seen without focal infiltrate or sizable effusion. No acute bony abnormality is noted.  IMPRESSION: No acute abnormality seen.   Electronically Signed   By: Inez Catalina M.D.   On: 03/17/2014 17:11   Dg Forearm Left  03/17/2014  CLINICAL DATA:  Wrist and distal forearm pain after falling.  EXAM: LEFT FOREARM - 2 VIEW  COMPARISON:  None.  FINDINGS: The bones are demineralized. There is a mildly impacted intra-articular fracture of the distal radius. There is a nondisplaced ulnar styloid fracture. No proximal injuries are identified. The alignment appears normal at the elbow. There is scapholunate diastasis. Scattered vascular calcifications are noted.  IMPRESSION: Impacted intra-articular fracture of the distal radius with nondisplaced ulnar styloid fracture.   Electronically Signed   By: Camie Patience M.D.   On: 03/17/2014 17:12   Dg Wrist Complete Left  03/17/2014   CLINICAL DATA:  Status post fall today with severe left wrist pain. Initial encounter.  EXAM: LEFT WRIST - COMPLETE 3+ VIEW  COMPARISON:  None.  FINDINGS: The patient has an acute and mildly impacted distal radius fracture with intra-articular extension. No other fracture is identified. There is marked soft tissue swelling about the wrist. First CMC and scaphoid trapezium trapezoid joint osteoarthritis is noted.  IMPRESSION: Mildly impacted distal radius fracture with intra-articular involvement and associated soft tissue swelling.   Electronically Signed   By: Inge Rise M.D.   On: 03/17/2014 17:12   Dg Ankle Complete Left  03/17/2014   CLINICAL DATA:  Left ankle pain following fall, initial encounter  EXAM: LEFT ANKLE COMPLETE - 3+ VIEW  COMPARISON:  None.  FINDINGS: Mild soft tissue swelling is noted laterally. No acute fracture or dislocation is seen. Diffuse vascular calcifications are noted.  IMPRESSION: Soft tissue swelling without acute bony abnormality.   Electronically Signed   By: Inez Catalina M.D.   On: 03/17/2014 17:18   Ct Head Wo Contrast  03/17/2014   CLINICAL DATA:  Status post fall with a blow to the head.  EXAM: CT HEAD WITHOUT CONTRAST  CT CERVICAL SPINE WITHOUT CONTRAST  TECHNIQUE: Multidetector CT imaging of the head and cervical spine was performed following the standard protocol without intravenous contrast. Multiplanar CT image reconstructions of the cervical spine were also generated.  COMPARISON:  Chest CT scan 08/03/2006.  FINDINGS: CT HEAD FINDINGS  Contusion is seen over the left frontal bone without underlying fracture. The brain is atrophic with chronic microvascular ischemic change. No evidence of acute intracranial abnormality including infarct, hemorrhage, mass lesion, mass effect, midline shift or abnormal extra-axial fluid collection. No hydrocephalus or pneumocephalus. Imaged paranasal  sinuses and mastoid air cells are clear.  CT CERVICAL SPINE FINDINGS  There is no fracture or malalignment of the cervical spine. Mild cervical spondylosis predominantly involves the facet joints. Lung apices demonstrate some scarring on the left, unchanged, and scattered ground-glass attenuation.  IMPRESSION: Hematoma over the left frontal bone without underlying fracture or acute intracranial abnormality.  No acute abnormality cervical spine.   Electronically Signed   By: Inge Rise M.D.   On: 03/17/2014 16:10   Ct Cervical Spine Wo Contrast  03/17/2014   CLINICAL DATA:  Status post fall with a blow to the head.  EXAM: CT HEAD WITHOUT CONTRAST  CT CERVICAL SPINE WITHOUT CONTRAST  TECHNIQUE: Multidetector CT imaging of the head and cervical spine was performed following the standard protocol without intravenous contrast. Multiplanar CT image reconstructions of the cervical spine were also generated.  COMPARISON:  Chest CT scan 08/03/2006.  FINDINGS: CT HEAD FINDINGS  Contusion is seen over the left frontal bone without underlying fracture. The brain is atrophic with chronic microvascular ischemic change. No evidence of acute intracranial abnormality including infarct, hemorrhage, mass lesion, mass effect,  midline shift or abnormal extra-axial fluid collection. No hydrocephalus or pneumocephalus. Imaged paranasal sinuses and mastoid air cells are clear.  CT CERVICAL SPINE FINDINGS  There is no fracture or malalignment of the cervical spine. Mild cervical spondylosis predominantly involves the facet joints. Lung apices demonstrate some scarring on the left, unchanged, and scattered ground-glass attenuation.  IMPRESSION: Hematoma over the left frontal bone without underlying fracture or acute intracranial abnormality.  No acute abnormality cervical spine.   Electronically Signed   By: Inge Rise M.D.   On: 03/17/2014 16:10   Pelvis Portable  03/20/2014   CLINICAL DATA:  Status post bipolar hip  arthroplasty.  EXAM: PORTABLE PELVIS 1-2 VIEWS  COMPARISON:  03/17/2014  FINDINGS: The patient is status post left hip bipolar arthroplasty. The hardware is in anatomic alignment and there are no complicating features identified. Specifically there is no evidence for device dislocation or periprosthetic fracture. Atherosclerotic calcifications noted.  IMPRESSION: 1. No complications after left hip arthroplasty.   Electronically Signed   By: Kerby Moors M.D.   On: 03/20/2014 16:57   Ct Hip Left Wo Contrast  03/17/2014   CLINICAL DATA:  Patient fell today. Questionable left hip fracture on radiographs.  EXAM: CT OF THE LEFT HIP WITHOUT CONTRAST  TECHNIQUE: Multidetector CT imaging of the left hip was performed according to the standard protocol. Multiplanar CT image reconstructions were also generated.  COMPARISON:  Current left hip radiographs  FINDINGS: Subcapital fracture of the left femoral neck. 3 shows slight impaction and apex anterior angulation. No significant comminution.  No other fractures.  Left hip joint normally aligned.  Mild subcutaneous edema lateral to the greater trochanter.  No soft tissue hematoma.  There are dense vascular calcifications.  Bones are diffusely demineralized.  IMPRESSION: 1. Subcapital left femoral neck fracture without significant displacement. There is slight impaction and apex anterior angulation. No comminution.   Electronically Signed   By: Lajean Manes M.D.   On: 03/17/2014 21:10   Chest Portable 1 View  03/18/2014   CLINICAL DATA:  Recent fall with multiple wound fractures  EXAM: PORTABLE CHEST - 1 VIEW  COMPARISON:  03/17/14  FINDINGS: Cardiomegaly is again noted. Tortuosity of the thoracic aorta with calcification is seen. Increased vascular congestion is noted as well as some bibasilar atelectatic changes. No sizable effusion is noted. No acute bony abnormality is seen.  IMPRESSION: Increasing vascular congestion with bibasilar atelectatic changes.    Electronically Signed   By: Inez Catalina M.D.   On: 03/18/2014 08:07   Dg Chest Port 1v Same Day  03/21/2014   CLINICAL DATA:  Shortness of breath, chills  EXAM: PORTABLE CHEST - 1 VIEW SAME DAY  COMPARISON:  03/18/2014  FINDINGS: Cardiomegaly again noted. No pulmonary edema. Persistent hazy right basilar atelectasis or infiltrate. Status post median sternotomy. Atherosclerotic calcifications of thoracic aorta.  IMPRESSION: Cardiomegaly. Persistent hazy right basilar atelectasis or infiltrate. No pulmonary edema.   Electronically Signed   By: Lahoma Crocker M.D.   On: 03/21/2014 19:27   Dg Knee Complete 4 Views Left  03/17/2014   CLINICAL DATA:  Fall, severe pain  EXAM: LEFT KNEE - COMPLETE 4+ VIEW  COMPARISON:  None.  FINDINGS: Four views of the left knee submitted. No acute fracture or subluxation. There is diffuse osteopenia. Significant narrowing of medial joint compartment. Mild chondrocalcinosis. Mild spurring of medial femoral condyle and medial tibial plateau. Narrowing of patellofemoral joint space. No joint effusion. Atherosclerotic calcifications of femoral and popliteal artery.  IMPRESSION:  No acute fracture or subluxation. Diffuse osteopenia. Osteoarthritic changes as described above. Mild chondrocalcinosis.   Electronically Signed   By: Lahoma Crocker M.D.   On: 03/17/2014 17:13   Dg Humerus Left  03/17/2014   CLINICAL DATA:  Recent fall with left arm pain  EXAM: LEFT HUMERUS - 2+ VIEW  COMPARISON:  None.  FINDINGS: The humerus is well visualized and within normal limits. Degenerative changes of the acromioclavicular joint are seen. No gross soft tissue abnormality is noted.  IMPRESSION: No acute abnormality seen.   Electronically Signed   By: Inez Catalina M.D.   On: 03/17/2014 17:12   Dg Hand Complete Right  03/17/2014   CLINICAL DATA:  Pain post fall  EXAM: RIGHT HAND - COMPLETE 3+ VIEW  COMPARISON:  None.  FINDINGS: Three views of the right hand submitted. No acute fracture or subluxation. There  is narrowing of radiocarpal joint space. Diffuse osteopenia. Mild chondrocalcinosis. Degenerative changes first carpometacarpal joint. Degenerative changes anterior aspect of scaphoid. Degenerative changes proximal and distal interphalangeal joints.  IMPRESSION: No acute fracture or subluxation. Diffuse osteopenia. Degenerative changes as described above.   Electronically Signed   By: Lahoma Crocker M.D.   On: 03/17/2014 17:15   Dg Hip Unilat With Pelvis 2-3 Views Left  03/17/2014   CLINICAL DATA:  Status post fall. Left hip pain with limited range of motion. Initial encounter.  EXAM: DG HIP W/ PELVIS 2-3V*L*  COMPARISON:  None.  FINDINGS: The bones are demineralized. There is deformity of the left femoral neck suspicious for a fracture. This is not definitive and could be related to an old injury. There is no evidence of pelvic fracture or dislocation. Mild degenerative changes are present at the hips and sacroiliac joints. There are diffuse vascular calcifications. Postsurgical changes are noted related to left inguinal hernia repair.  IMPRESSION: Left femoral neck deformity suspicious for a nondisplaced fracture, age indeterminate. CT suggested for further evaluation.   Electronically Signed   By: Camie Patience M.D.   On: 03/17/2014 17:15    ASSESSMENT/PLAN:  Left femoral neck fracture S/P arthroplasty - for rehabilitation; continue Lovenox 40 mg subcutaneous daily 3 weeks for DVT prophylaxis and Ultracet. 37.5/325 mg by mouth every 6 hours when necessary for pain; follow-up with orthopedics, Dr. Alvan Dame, in 2 weeks Left radius fracture - continue splint and sling CAD - stable; continue aspirin 81 mg daily Hypertension - well controlled; continue Cardizem 60 mg by mouth twice a day and Lopressor 25 mg by mouth twice a day Thrombocytopenia - platelet 85; monitor platelets Hyperlipidemia - continue Lipitor 10 mg by mouth daily Atrial fibrillation - rate controlled; continue Cardizem and Lopressor; patient  was on chronic anticoagulation with Coumadin however Coumadin was discontinued since patient is high risk for falls; continue aspirin 81 mg by mouth daily GERD - stable; continue Prilosec 20 mg by mouth daily    Goals of care:  Short-term rehabilitation    Labs/test ordered:   CBC and CMP   Spent 50 minutes in patient care.   [facility], NP [employer] [phone]

## 2014-03-25 ENCOUNTER — Non-Acute Institutional Stay (SKILLED_NURSING_FACILITY): Payer: Medicare Other | Admitting: Internal Medicine

## 2014-03-25 ENCOUNTER — Encounter: Payer: Self-pay | Admitting: Internal Medicine

## 2014-03-25 DIAGNOSIS — I2510 Atherosclerotic heart disease of native coronary artery without angina pectoris: Secondary | ICD-10-CM

## 2014-03-25 DIAGNOSIS — K219 Gastro-esophageal reflux disease without esophagitis: Secondary | ICD-10-CM

## 2014-03-25 DIAGNOSIS — S52502S Unspecified fracture of the lower end of left radius, sequela: Secondary | ICD-10-CM | POA: Diagnosis not present

## 2014-03-25 DIAGNOSIS — E785 Hyperlipidemia, unspecified: Secondary | ICD-10-CM | POA: Diagnosis not present

## 2014-03-25 DIAGNOSIS — S72002S Fracture of unspecified part of neck of left femur, sequela: Secondary | ICD-10-CM | POA: Diagnosis not present

## 2014-03-25 DIAGNOSIS — I10 Essential (primary) hypertension: Secondary | ICD-10-CM

## 2014-03-25 DIAGNOSIS — R195 Other fecal abnormalities: Secondary | ICD-10-CM

## 2014-03-25 DIAGNOSIS — I482 Chronic atrial fibrillation, unspecified: Secondary | ICD-10-CM

## 2014-03-25 DIAGNOSIS — R1310 Dysphagia, unspecified: Secondary | ICD-10-CM | POA: Diagnosis not present

## 2014-03-25 NOTE — Progress Notes (Signed)
Patient ID: [name] Chang, male   DOB: December 27, 1915, 81 y.o.   MRN: [id number]     [facility]   PCP: No primary care provider on file.  Code Status: full code  No Known Allergies  Chief Complaint  Patient presents with  . New Admit To SNF     HPI:  81 year old patient is here for short term rehabilitation post hospital admission from 03/17/14-03/23/14 post fall with left femoral neck fracture and left radius fracture. He underwent arthroplasty for the left leg and left arm was splinted. His coumadin was discontinued with high fall risk.  He has pmh of afib, CAD, HLD, HTN among others He is seen in his room today. He has been having loose stool for few days, last bowel movement yesterday.he also complaints of difficulty swallowing both solid and liquid food, denies any pain with swallowing. Denies heartburn. His pain in the left arm and leg are not under control with current pain regimen  Review of Systems:  Constitutional: Negative for fever, chills, diaphoresis.  HENT: Negative for headache, congestion Eyes: Negative for eye pain, blurred vision, double vision and discharge.  Respiratory: Negative for cough, shortness of breath and wheezing.   Cardiovascular: Negative for chest pain, palpitations, leg swelling.  Gastrointestinal: Negative for heartburn, nausea, vomiting, abdominal pain Genitourinary: Negative for dysuria Musculoskeletal: Negative for falls in facility Skin: Negative for itching, rash.  Neurological: Negative for dizziness, tingling, focal weakness Psychiatric/Behavioral: Negative for depression  Past Medical History  Diagnosis Date  . Hypertension   . High cholesterol   . Heart trouble   . Osteoarthritis   . Bone cancer   . Bilateral swelling of feet    Past Surgical History  Procedure Laterality Date  . Cholecystectomy    . Hip arthroplasty Left 03/20/2014    Procedure: ARTHROPLASTY BIPOLAR HIP;  Surgeon: Mauri Pole,  MD;  Location: [facility];  Service: Orthopedics;  Laterality: Left;   Social History:   reports that he has never smoked. He has never used smokeless tobacco. He reports that he drinks alcohol. His drug history is not on file.  History reviewed. No pertinent family history.  Medications: Patient's Medications  New Prescriptions   No medications on file  Previous Medications   ASPIRIN 81 MG CHEWABLE TABLET    Chew 1 tablet (81 mg total) by mouth daily.   ATORVASTATIN (LIPITOR) 10 MG TABLET    Take 10 mg by mouth daily.   CHONDROITIN SULFATE PO    Take 1 tablet by mouth 2 (two) times daily.    DILTIAZEM (CARDIZEM) 60 MG TABLET    Take 60 mg by mouth 2 (two) times daily.    ENOXAPARIN (LOVENOX) 40 MG/0.4ML INJECTION    Inject 0.4 mLs (40 mg total) into the skin daily.   GLUCOSAMINE 750 MG TABS    Take 750 mg by mouth 3 (three) times daily.   METOPROLOL (LOPRESSOR) 50 MG TABLET    Take 25 mg by mouth 2 (two) times daily.   MULTIPLE VITAMIN (MULTIVITAMIN WITH MINERALS) TABS TABLET    Take 1 tablet by mouth daily.   MULTIPLE VITAMINS-MINERALS (PRESERVISION AREDS 2) CAPS    Take 1 tablet by mouth 2 (two) times daily.   OMEGA 3 1200 MG CAPS    Take 1,200 mg by mouth daily.   OMEPRAZOLE (PRILOSEC OTC) 20 MG TABLET    Take 20 mg by mouth daily.   PSYLLIUM (METAMUCIL) 58.6 % PACKET  Take 1 packet by mouth daily.   TRAMADOL-ACETAMINOPHEN (ULTRACET) 37.5-325 MG PER TABLET    Take 1 tablet by mouth every 6 (six) hours as needed.  Modified Medications   No medications on file  Discontinued Medications   No medications on file     Physical Exam: Filed Vitals:   03/25/14 1706  BP: 126/70  Pulse: 80  Temp: 98.9 F (37.2 C)  Resp: 18  SpO2: 92%    General- elderly male, in no acute distress Head- normocephalic, left head bruise Throat- moist mucus membrane Neck- no cervical lymphadenopathy Cardiovascular- normal s1,s2, no murmurs, palpable dorsalis pedis, no leg edema Respiratory-  bilateral clear to auscultation, no wheeze, no rhonchi, no crackles, no use of accessory muscles Abdomen- bowel sounds present, soft, non tender Musculoskeletal- able to move all 4 extremities, left hip rom limited, left arm in splint and sling, left middle finger has splint, able to move other fingers and good circulation Neurological- no focal deficit Skin- warm and dry, bruises with thin skin, right arm skin tear, left hip aquacel dressing, chronic venous stasis changes in both legs Psychiatry- alert and oriented to person, place and time, normal mood and affect    Labs reviewed: Basic Metabolic Panel:  Recent Labs  03/19/14 0643 03/21/14 0413 03/22/14 0458  NA 134* 132* 133*  K 4.1 3.7 3.7  CL 101 100 96  CO2 24 19 25   GLUCOSE 154* 148* 155*  BUN 20 19 18   CREATININE 0.80 0.71 0.65  CALCIUM 8.7 8.2* 8.2*   Liver Function Tests: No results for input(s): AST, ALT, ALKPHOS, BILITOT, PROT, ALBUMIN in the last 8760 hours. No results for input(s): LIPASE, AMYLASE in the last 8760 hours. No results for input(s): AMMONIA in the last 8760 hours. CBC:  Recent Labs  03/17/14 1525  03/19/14 0643 03/21/14 0413 03/22/14 0458  WBC 8.1  < > 10.1 8.0 9.2  NEUTROABS 6.4  --   --   --   --   HGB 13.1  < > 13.1 11.8* 12.2*  HCT 39.4  < > 39.6 35.7* 36.4*  MCV 95.4  < > 95.4 94.7 94.5  PLT 102*  < > 63* 75* 85*  < > = values in this interval not displayed.   Assessment/Plan  Left femoral neck fracture  S/P arthroplasty. Will have him work with physical therapy and occupational therapy team to help with gait training and muscle strengthening exercises.fall precautions. Skin care. Encourage to be out of bed. Continue Lovenox 40 mg subcutaneous daily 3 weeks for DVT prophylaxis. Will start tramadol 100 mg bid and continue ultracet q6h prn for breakthrough pain. Reassess. Has follow up with Dr. Alvan Dame.  Left radius fracture continue splint and sling, encouraged to move his fingers.  Pain med changes as above  Dysphagia Denies odynophagia, increase dosing of PPI as below, order barium swallow. SLP has been seeing patient. To have him upright for all meals. Continue mechanical soft diet with thin liquid  Loose stool Is on stool softeners which could be contributing to this, change colace to 100 mg qhs prn only and d/c metamucil for now. Reassess. Encouraged hydration  CAD Remains chest pain free.  continue lopressor 25 mg bid, aspirin 81 mg daily, lipitor 10 mg daily  Hypertension  continue Cardizem 60 mg bid and Lopressor 25 mg bid  Hyperlipidemia continue Lipitor 10 mg daily  Atrial fibrillation   continue Cardizem and Lopressor for rate control with baby aspirin for anticoagulation  GERD Change Prilosec  to 40 mg daily   Goals of care: short term rehabilitation    Labs/tests ordered: cbc, cmp  Family/ staff Communication: reviewed care plan with patient and nursing supervisor    Blanchie Serve, MD  [facility] [phone] (Monday-Friday 8 am - 5 pm) [phone] (afterhours)

## 2014-03-27 ENCOUNTER — Other Ambulatory Visit (HOSPITAL_COMMUNITY): Payer: Self-pay | Admitting: Internal Medicine

## 2014-03-27 DIAGNOSIS — R1310 Dysphagia, unspecified: Secondary | ICD-10-CM

## 2014-03-31 DIAGNOSIS — S52572D Other intraarticular fracture of lower end of left radius, subsequent encounter for closed fracture with routine healing: Secondary | ICD-10-CM | POA: Diagnosis not present

## 2014-03-31 DIAGNOSIS — M79644 Pain in right finger(s): Secondary | ICD-10-CM | POA: Diagnosis not present

## 2014-04-01 ENCOUNTER — Ambulatory Visit (HOSPITAL_COMMUNITY)
Admission: RE | Admit: 2014-04-01 | Discharge: 2014-04-01 | Disposition: A | Discharge status: Home / Self Care | Payer: Medicare Other | Source: Ambulatory Visit | Attending: Internal Medicine | Admitting: Internal Medicine

## 2014-04-01 DIAGNOSIS — I10 Essential (primary) hypertension: Secondary | ICD-10-CM | POA: Diagnosis not present

## 2014-04-01 DIAGNOSIS — Z96649 Presence of unspecified artificial hip joint: Secondary | ICD-10-CM | POA: Diagnosis not present

## 2014-04-01 DIAGNOSIS — I252 Old myocardial infarction: Secondary | ICD-10-CM | POA: Diagnosis not present

## 2014-04-01 DIAGNOSIS — E780 Pure hypercholesterolemia: Secondary | ICD-10-CM | POA: Diagnosis not present

## 2014-04-01 DIAGNOSIS — R1310 Dysphagia, unspecified: Secondary | ICD-10-CM | POA: Diagnosis not present

## 2014-04-01 NOTE — Procedures (Signed)
Objective Swallowing Evaluation: Modified Barium Swallowing Study  Patient Details  Name: [name] Chang MRN: [id number] Date of Birth: [birth year]  Today's Date: 04/01/2014 Time: SLP Start Time (ACUTE ONLY): 1110-SLP Stop Time (ACUTE ONLY): 1145 SLP Time Calculation (min) (ACUTE ONLY): 35 min  Past Medical History:  Past Medical History  Diagnosis Date  . Hypertension   . High cholesterol   . Heart trouble   . Osteoarthritis   . Bone cancer   . Bilateral swelling of feet    Past Surgical History:  Past Surgical History  Procedure Laterality Date  . Cholecystectomy    . Hip arthroplasty Left 03/20/2014    Procedure: ARTHROPLASTY BIPOLAR HIP;  Surgeon: Mauri Pole, MD;  Location: [place];  Service: Orthopedics;  Laterality: Left;   HPI:  HPI: 81 yr old from [place] seen for outpatient MBS. Pt underwent recent surgery for hip fracture and experienced pharyngeal globus sensation likely due to intubation due to pt's  He and daughter report swallow "is better now." Additional PMH: bone cancer, HTN, GERD, hiatal hernia, MI.   No Data Recorded  Assessment / Plan / Recommendation CHL IP CLINICAL IMPRESSIONS 04/01/2014  Dysphagia Diagnosis WFL  Clinical impression Pt exhibited functional oropharyngeal swallow ability characterized by timely initiation, adequate tongue base retraction and laryngeal elevation. No aspiration observed; questionable flash penetration of thin during trial of pill and thin. Oral prep, mastication and transit WFL's. Brief view of esophagus did not reveal abnormalities. SLP recommends upgrading diet texture to regular, continue thin liquids and suggested pills whole in puree (pt dislikes applesauce and pudding; likes yogurt and asked daughter to bring yogurt to keep at facility).        CHL IP TREATMENT RECOMMENDATION 04/01/2014  Treatment Plan Recommendations No treatment recommended at this time     CHL IP DIET RECOMMENDATION 04/01/2014  Diet Recommendations  Regular;Thin liquid  Liquid Administration via Cup;Straw  Medication Administration Whole meds with puree  Compensations Slow rate;Small sips/bites  Postural Changes and/or Swallow Maneuvers Out of bed for meals;Seated upright 90 degrees;Upright 30-60 min after meal     CHL IP OTHER RECOMMENDATIONS 04/01/2014  Recommended Consults (None)  Oral Care Recommendations Oral care BID  Other Recommendations (None)     CHL IP FOLLOW UP RECOMMENDATIONS 04/01/2014  Follow up Recommendations None     No flowsheet data found.   Pertinent Vitals/Pain none    SLP Swallow Goals No flowsheet data found.  No flowsheet data found.    CHL IP REASON FOR REFERRAL 04/01/2014  Reason for Referral Objectively evaluate swallowing function     CHL IP ORAL PHASE 04/01/2014  Lips (None)  Tongue (None)  Mucous membranes (None)  Nutritional status (None)  Other (None)  Oxygen therapy (None)  Oral Phase WFL  Oral - Pudding Teaspoon (None)  Oral - Pudding Cup (None)  Oral - Honey Teaspoon (None)  Oral - Honey Cup (None)  Oral - Honey Syringe (None)  Oral - Nectar Teaspoon (None)  Oral - Nectar Cup (None)  Oral - Nectar Straw (None)  Oral - Nectar Syringe (None)  Oral - Ice Chips (None)  Oral - Thin Teaspoon (None)  Oral - Thin Cup (None)  Oral - Thin Straw (None)  Oral - Thin Syringe (None)  Oral - Puree (None)  Oral - Mechanical Soft (None)  Oral - Regular (None)  Oral - Multi-consistency (None)  Oral - Pill (None)  Oral Phase - Comment (None)      CHL IP PHARYNGEAL  PHASE 04/01/2014  Pharyngeal Phase WFL  Pharyngeal - Pudding Teaspoon (None)  Penetration/Aspiration details (pudding teaspoon) (None)  Pharyngeal - Pudding Cup (None)  Penetration/Aspiration details (pudding cup) (None)  Pharyngeal - Honey Teaspoon (None)  Penetration/Aspiration details (honey teaspoon) (None)  Pharyngeal - Honey Cup (None)  Penetration/Aspiration details (honey cup) (None)  Pharyngeal - Honey Syringe (None)   Penetration/Aspiration details (honey syringe) (None)  Pharyngeal - Nectar Teaspoon (None)  Penetration/Aspiration details (nectar teaspoon) (None)  Pharyngeal - Nectar Cup (None)  Penetration/Aspiration details (nectar cup) (None)  Pharyngeal - Nectar Straw (None)  Penetration/Aspiration details (nectar straw) (None)  Pharyngeal - Nectar Syringe (None)  Penetration/Aspiration details (nectar syringe) (None)  Pharyngeal - Ice Chips (None)  Penetration/Aspiration details (ice chips) (None)  Pharyngeal - Thin Teaspoon (None)  Penetration/Aspiration details (thin teaspoon) (None)  Pharyngeal - Thin Cup (None)  Penetration/Aspiration details (thin cup) (None)  Pharyngeal - Thin Straw (None)  Penetration/Aspiration details (thin straw) (None)  Pharyngeal - Thin Syringe (None)  Penetration/Aspiration details (thin syringe') (None)  Pharyngeal - Puree (None)  Penetration/Aspiration details (puree) (None)  Pharyngeal - Mechanical Soft (None)  Penetration/Aspiration details (mechanical soft) (None)  Pharyngeal - Regular (None)  Penetration/Aspiration details (regular) (None)  Pharyngeal - Multi-consistency (None)  Penetration/Aspiration details (multi-consistency) (None)  Pharyngeal - Pill (None)  Penetration/Aspiration details (pill) (None)  Pharyngeal Comment (None)     CHL IP CERVICAL ESOPHAGEAL PHASE 04/01/2014  Cervical Esophageal Phase WFL  Pudding Teaspoon (None)  Pudding Cup (None)  Honey Teaspoon (None)  Honey Cup (None)  Honey Syringe (None)  Nectar Teaspoon (None)  Nectar Cup (None)  Nectar Straw (None)  Nectar Syringe (None)  Thin Teaspoon (None)  Thin Cup (None)  Thin Straw (None)  Thin Syringe (None)  Cervical Esophageal Comment (None)    CHL IP GO 04/01/2014  Functional Assessment Tool Used skilled clinical judgement  Functional Limitations Swallowing  Swallow Current Status (E2683) CH  Swallow Goal Status (M1962) [facility]  Swallow Discharge Status (I2979) CH   Motor Speech Current Status (G9211) (None)  Motor Speech Goal Status (H4174) (None)  Motor Speech Goal Status (Y8144) (None)  Spoken Language Comprehension Current Status (Y1856) (None)  Spoken Language Comprehension Goal Status (D1497) (None)  Spoken Language Comprehension Discharge Status (W2637) (None)  Spoken Language Expression Current Status (C5885) (None)  Spoken Language Expression Goal Status (O2774) (None)  Spoken Language Expression Discharge Status [phone]) (None)  Attention Current Status (M7672) (None)  Attention Goal Status (C9470) (None)  Attention Discharge Status (J6283) (None)  Memory Current Status (M6294) (None)  Memory Goal Status (T6546) (None)  Memory Discharge Status (T0354) (None)  Voice Current Status (S5681) (None)  Voice Goal Status (E7517) (None)  Voice Discharge Status (G0174) (None)  Other Speech-Language Pathology Functional Limitation [phone]) (None)  Other Speech-Language Pathology Functional Limitation Goal Status (P5916) (None)  Other Speech-Language Pathology Functional Limitation Discharge Status [phone]) (None)           Houston Siren 04/01/2014, 1:58 PM   Orbie Pyo Colvin Caroli.Ed [employer] [phone]

## 2014-04-08 DIAGNOSIS — M79644 Pain in right finger(s): Secondary | ICD-10-CM | POA: Diagnosis not present

## 2014-04-08 DIAGNOSIS — Z471 Aftercare following joint replacement surgery: Secondary | ICD-10-CM | POA: Diagnosis not present

## 2014-04-08 DIAGNOSIS — Z96642 Presence of left artificial hip joint: Secondary | ICD-10-CM | POA: Diagnosis not present

## 2014-04-08 DIAGNOSIS — S52572D Other intraarticular fracture of lower end of left radius, subsequent encounter for closed fracture with routine healing: Secondary | ICD-10-CM | POA: Diagnosis not present

## 2014-04-22 DIAGNOSIS — S52572D Other intraarticular fracture of lower end of left radius, subsequent encounter for closed fracture with routine healing: Secondary | ICD-10-CM | POA: Diagnosis not present

## 2014-05-12 DIAGNOSIS — S60412D Abrasion of right middle finger, subsequent encounter: Secondary | ICD-10-CM | POA: Diagnosis not present

## 2014-05-21 ENCOUNTER — Non-Acute Institutional Stay (SKILLED_NURSING_FACILITY): Payer: Medicare Other | Admitting: Adult Health

## 2014-05-21 ENCOUNTER — Encounter: Payer: Self-pay | Admitting: Adult Health

## 2014-05-21 DIAGNOSIS — I482 Chronic atrial fibrillation, unspecified: Secondary | ICD-10-CM

## 2014-05-21 DIAGNOSIS — S52502S Unspecified fracture of the lower end of left radius, sequela: Secondary | ICD-10-CM

## 2014-05-21 DIAGNOSIS — E785 Hyperlipidemia, unspecified: Secondary | ICD-10-CM | POA: Diagnosis not present

## 2014-05-21 DIAGNOSIS — K5900 Constipation, unspecified: Secondary | ICD-10-CM

## 2014-05-21 DIAGNOSIS — I2510 Atherosclerotic heart disease of native coronary artery without angina pectoris: Secondary | ICD-10-CM

## 2014-05-21 DIAGNOSIS — I10 Essential (primary) hypertension: Secondary | ICD-10-CM

## 2014-05-21 DIAGNOSIS — S72002S Fracture of unspecified part of neck of left femur, sequela: Secondary | ICD-10-CM | POA: Diagnosis not present

## 2014-05-21 DIAGNOSIS — K219 Gastro-esophageal reflux disease without esophagitis: Secondary | ICD-10-CM | POA: Diagnosis not present

## 2014-05-21 NOTE — Progress Notes (Signed)
Patient ID: [name] Chang, male   DOB: 01-Apr-1915, 81 y.o.   MRN: [id number]   05/21/2014  Facility:  [facility] Location:  [place] Room Number: 705-P LEVEL OF CARE:  SNF (31)   Chief Complaint  Patient presents with  . Discharge Note    Left femoral neck fracture S/P arthroplasty, left radius fracture, CAD, hypertension, hyperlipidemia, atrial fibrillation and GERD    HISTORY OF PRESENT ILLNESS:  This is a 81 year old male who is for discharge home with home health PT for endurance, OT for ADLs, CNA for showers and nursing for medication management.  He has been admitted to [facility] on 03/23/14 from [facility]. He fell while getting into his car. He landed on his left side and hit his head. He sustained a left nondisplaced fracture of femoral neck and left radius fracture. He had left hip arthroplasty and left radius was splinted. He has PMH of CAD, hypertension, atrial fibrillation and hyperlipidemia.   Patient was admitted to this facility for short-term rehabilitation after the patient's recent hospitalization.  Patient has completed SNF rehabilitation and therapy has cleared the patient for discharge.  PAST MEDICAL HISTORY:  Past Medical History  Diagnosis Date  . Hypertension   . High cholesterol   . Heart trouble   . Osteoarthritis   . Bone cancer   . Bilateral swelling of feet     CURRENT MEDICATIONS: Reviewed per MAR/see medication list  No Known Allergies   REVIEW OF SYSTEMS:  GENERAL: no change in appetite, no fatigue, no weight changes, no fever, chills or weakness RESPIRATORY: no cough, SOB, DOE, wheezing, hemoptysis CARDIAC: no chest pain, edema or palpitations GI: no abdominal pain, diarrhea, constipation, heart burn, nausea or vomiting  PHYSICAL EXAMINATION  GENERAL: no acute distress, normal body habitus NECK: supple, trachea midline, no neck masses, no thyroid tenderness, no thyromegaly LYMPHATICS: no LAN  in the neck, no supraclavicular LAN RESPIRATORY: breathing is even & unlabored, BS CTAB CARDIAC: RRR, no murmur,no extra heart sounds, no edema GI: abdomen soft, normal BS, no masses, no tenderness, no hepatomegaly, no splenomegaly EXTREMITIES: He is able to move 4 extremities; left forearm has a splint  PSYCHIATRIC: the patient is alert & oriented to person, affect & behavior appropriate  LABS/RADIOLOGY: 03/25/14  WBC 10.8 hemoglobin 12.5 hematocrit 37.1 MCV 95.6 Labs reviewed: Basic Metabolic Panel:  Recent Labs  03/19/14 0643 03/21/14 0413 03/22/14 0458  NA 134* 132* 133*  K 4.1 3.7 3.7  CL 101 100 96  CO2 24 19 25   GLUCOSE 154* 148* 155*  BUN 20 19 18   CREATININE 0.80 0.71 0.65  CALCIUM 8.7 8.2* 8.2*   CBC:  Recent Labs  03/17/14 1525  03/19/14 0643 03/21/14 0413 03/22/14 0458  WBC 8.1  < > 10.1 8.0 9.2  NEUTROABS 6.4  --   --   --   --   HGB 13.1  < > 13.1 11.8* 12.2*  HCT 39.4  < > 39.6 35.7* 36.4*  MCV 95.4  < > 95.4 94.7 94.5  PLT 102*  < > 63* 75* 85*  < > = values in this interval not displayed.  No results found.  ASSESSMENT/PLAN:  Left femoral neck fracture S/P arthroplasty - for home health PT, OT, CNA and Nursing; start Ultram 50 mg 1-2 tabs PO Q 6 hours PRN for pain, discontinue Ultracet follow-up with orthopedics, Dr. Alvan Dame Left radius fracture - continue weaning off splint  CAD - stable;  continue aspirin 81 mg daily Hypertension - well controlled; continue Cardizem 60 mg by mouth twice a day and Lopressor 25 mg by mouth twice a day Hyperlipidemia - continue Lipitor 10 mg by mouth daily Atrial fibrillation - rate controlled; continue Cardizem and Lopressor; patient was on chronic anticoagulation with Coumadin however Coumadin was discontinued since patient is high risk for falls; continue aspirin 81 mg by mouth daily GERD - stable; continue Prilosec 20 mg by mouth daily Constipation - continue Colace 100 mg by mouth daily at bedtime when necessary     I have filled out patient's discharge paperwork and written prescriptions.  Patient will receive home health PT, OT, Nursing and CNA.  Total discharge time: Greater than 30 minutes  Discharge time involved coordination of the discharge process with social worker, nursing staff and therapy department. Medical justification for home health services verified.  [facility], NP [employer] [phone]

## 2014-05-24 DIAGNOSIS — S52502S Unspecified fracture of the lower end of left radius, sequela: Secondary | ICD-10-CM | POA: Diagnosis not present

## 2014-05-24 DIAGNOSIS — Z471 Aftercare following joint replacement surgery: Secondary | ICD-10-CM | POA: Diagnosis not present

## 2014-05-24 DIAGNOSIS — I2510 Atherosclerotic heart disease of native coronary artery without angina pectoris: Secondary | ICD-10-CM | POA: Diagnosis not present

## 2014-05-24 DIAGNOSIS — I10 Essential (primary) hypertension: Secondary | ICD-10-CM | POA: Diagnosis not present

## 2014-05-24 DIAGNOSIS — R278 Other lack of coordination: Secondary | ICD-10-CM | POA: Diagnosis not present

## 2014-05-24 DIAGNOSIS — M6281 Muscle weakness (generalized): Secondary | ICD-10-CM | POA: Diagnosis not present

## 2014-05-24 DIAGNOSIS — D699 Hemorrhagic condition, unspecified: Secondary | ICD-10-CM | POA: Diagnosis not present

## 2014-05-24 DIAGNOSIS — E785 Hyperlipidemia, unspecified: Secondary | ICD-10-CM | POA: Diagnosis not present

## 2014-05-24 DIAGNOSIS — Z96642 Presence of left artificial hip joint: Secondary | ICD-10-CM | POA: Diagnosis not present

## 2014-05-24 DIAGNOSIS — I482 Chronic atrial fibrillation: Secondary | ICD-10-CM | POA: Diagnosis not present

## 2014-05-24 DIAGNOSIS — S72002S Fracture of unspecified part of neck of left femur, sequela: Secondary | ICD-10-CM | POA: Diagnosis not present

## 2014-05-26 DIAGNOSIS — Z471 Aftercare following joint replacement surgery: Secondary | ICD-10-CM | POA: Diagnosis not present

## 2014-05-26 DIAGNOSIS — I10 Essential (primary) hypertension: Secondary | ICD-10-CM | POA: Diagnosis not present

## 2014-05-26 DIAGNOSIS — I482 Chronic atrial fibrillation: Secondary | ICD-10-CM | POA: Diagnosis not present

## 2014-05-26 DIAGNOSIS — R278 Other lack of coordination: Secondary | ICD-10-CM | POA: Diagnosis not present

## 2014-05-26 DIAGNOSIS — S72002S Fracture of unspecified part of neck of left femur, sequela: Secondary | ICD-10-CM | POA: Diagnosis not present

## 2014-05-26 DIAGNOSIS — S52502S Unspecified fracture of the lower end of left radius, sequela: Secondary | ICD-10-CM | POA: Diagnosis not present

## 2014-05-26 DIAGNOSIS — E785 Hyperlipidemia, unspecified: Secondary | ICD-10-CM | POA: Diagnosis not present

## 2014-05-26 DIAGNOSIS — D699 Hemorrhagic condition, unspecified: Secondary | ICD-10-CM | POA: Diagnosis not present

## 2014-05-26 DIAGNOSIS — M6281 Muscle weakness (generalized): Secondary | ICD-10-CM | POA: Diagnosis not present

## 2014-05-26 DIAGNOSIS — Z96642 Presence of left artificial hip joint: Secondary | ICD-10-CM | POA: Diagnosis not present

## 2014-05-26 DIAGNOSIS — I2510 Atherosclerotic heart disease of native coronary artery without angina pectoris: Secondary | ICD-10-CM | POA: Diagnosis not present

## 2014-05-28 DIAGNOSIS — M6281 Muscle weakness (generalized): Secondary | ICD-10-CM | POA: Diagnosis not present

## 2014-05-28 DIAGNOSIS — Z96642 Presence of left artificial hip joint: Secondary | ICD-10-CM | POA: Diagnosis not present

## 2014-05-28 DIAGNOSIS — I2510 Atherosclerotic heart disease of native coronary artery without angina pectoris: Secondary | ICD-10-CM | POA: Diagnosis not present

## 2014-05-28 DIAGNOSIS — Z471 Aftercare following joint replacement surgery: Secondary | ICD-10-CM | POA: Diagnosis not present

## 2014-05-28 DIAGNOSIS — R278 Other lack of coordination: Secondary | ICD-10-CM | POA: Diagnosis not present

## 2014-05-28 DIAGNOSIS — S52502S Unspecified fracture of the lower end of left radius, sequela: Secondary | ICD-10-CM | POA: Diagnosis not present

## 2014-05-28 DIAGNOSIS — E785 Hyperlipidemia, unspecified: Secondary | ICD-10-CM | POA: Diagnosis not present

## 2014-05-28 DIAGNOSIS — S72002S Fracture of unspecified part of neck of left femur, sequela: Secondary | ICD-10-CM | POA: Diagnosis not present

## 2014-05-28 DIAGNOSIS — I10 Essential (primary) hypertension: Secondary | ICD-10-CM | POA: Diagnosis not present

## 2014-05-28 DIAGNOSIS — D699 Hemorrhagic condition, unspecified: Secondary | ICD-10-CM | POA: Diagnosis not present

## 2014-05-28 DIAGNOSIS — I482 Chronic atrial fibrillation: Secondary | ICD-10-CM | POA: Diagnosis not present

## 2014-05-29 DIAGNOSIS — Z96642 Presence of left artificial hip joint: Secondary | ICD-10-CM | POA: Diagnosis not present

## 2014-05-29 DIAGNOSIS — S52502S Unspecified fracture of the lower end of left radius, sequela: Secondary | ICD-10-CM | POA: Diagnosis not present

## 2014-05-29 DIAGNOSIS — M6281 Muscle weakness (generalized): Secondary | ICD-10-CM | POA: Diagnosis not present

## 2014-05-29 DIAGNOSIS — S72002S Fracture of unspecified part of neck of left femur, sequela: Secondary | ICD-10-CM | POA: Diagnosis not present

## 2014-05-29 DIAGNOSIS — R278 Other lack of coordination: Secondary | ICD-10-CM | POA: Diagnosis not present

## 2014-05-29 DIAGNOSIS — Z471 Aftercare following joint replacement surgery: Secondary | ICD-10-CM | POA: Diagnosis not present

## 2014-05-29 DIAGNOSIS — I482 Chronic atrial fibrillation: Secondary | ICD-10-CM | POA: Diagnosis not present

## 2014-05-29 DIAGNOSIS — I10 Essential (primary) hypertension: Secondary | ICD-10-CM | POA: Diagnosis not present

## 2014-05-29 DIAGNOSIS — E785 Hyperlipidemia, unspecified: Secondary | ICD-10-CM | POA: Diagnosis not present

## 2014-05-29 DIAGNOSIS — I2510 Atherosclerotic heart disease of native coronary artery without angina pectoris: Secondary | ICD-10-CM | POA: Diagnosis not present

## 2014-05-29 DIAGNOSIS — D699 Hemorrhagic condition, unspecified: Secondary | ICD-10-CM | POA: Diagnosis not present

## 2014-06-01 DIAGNOSIS — I10 Essential (primary) hypertension: Secondary | ICD-10-CM | POA: Diagnosis not present

## 2014-06-01 DIAGNOSIS — S72002S Fracture of unspecified part of neck of left femur, sequela: Secondary | ICD-10-CM | POA: Diagnosis not present

## 2014-06-01 DIAGNOSIS — D699 Hemorrhagic condition, unspecified: Secondary | ICD-10-CM | POA: Diagnosis not present

## 2014-06-01 DIAGNOSIS — Z96642 Presence of left artificial hip joint: Secondary | ICD-10-CM | POA: Diagnosis not present

## 2014-06-01 DIAGNOSIS — Z471 Aftercare following joint replacement surgery: Secondary | ICD-10-CM | POA: Diagnosis not present

## 2014-06-01 DIAGNOSIS — M6281 Muscle weakness (generalized): Secondary | ICD-10-CM | POA: Diagnosis not present

## 2014-06-01 DIAGNOSIS — S52502S Unspecified fracture of the lower end of left radius, sequela: Secondary | ICD-10-CM | POA: Diagnosis not present

## 2014-06-01 DIAGNOSIS — I2510 Atherosclerotic heart disease of native coronary artery without angina pectoris: Secondary | ICD-10-CM | POA: Diagnosis not present

## 2014-06-01 DIAGNOSIS — E785 Hyperlipidemia, unspecified: Secondary | ICD-10-CM | POA: Diagnosis not present

## 2014-06-01 DIAGNOSIS — I482 Chronic atrial fibrillation: Secondary | ICD-10-CM | POA: Diagnosis not present

## 2014-06-01 DIAGNOSIS — R278 Other lack of coordination: Secondary | ICD-10-CM | POA: Diagnosis not present

## 2014-06-02 DIAGNOSIS — I10 Essential (primary) hypertension: Secondary | ICD-10-CM | POA: Diagnosis not present

## 2014-06-02 DIAGNOSIS — I482 Chronic atrial fibrillation: Secondary | ICD-10-CM | POA: Diagnosis not present

## 2014-06-02 DIAGNOSIS — R278 Other lack of coordination: Secondary | ICD-10-CM | POA: Diagnosis not present

## 2014-06-02 DIAGNOSIS — Z471 Aftercare following joint replacement surgery: Secondary | ICD-10-CM | POA: Diagnosis not present

## 2014-06-02 DIAGNOSIS — S52502S Unspecified fracture of the lower end of left radius, sequela: Secondary | ICD-10-CM | POA: Diagnosis not present

## 2014-06-02 DIAGNOSIS — S72002S Fracture of unspecified part of neck of left femur, sequela: Secondary | ICD-10-CM | POA: Diagnosis not present

## 2014-06-02 DIAGNOSIS — Z96642 Presence of left artificial hip joint: Secondary | ICD-10-CM | POA: Diagnosis not present

## 2014-06-02 DIAGNOSIS — E785 Hyperlipidemia, unspecified: Secondary | ICD-10-CM | POA: Diagnosis not present

## 2014-06-02 DIAGNOSIS — I2510 Atherosclerotic heart disease of native coronary artery without angina pectoris: Secondary | ICD-10-CM | POA: Diagnosis not present

## 2014-06-02 DIAGNOSIS — D699 Hemorrhagic condition, unspecified: Secondary | ICD-10-CM | POA: Diagnosis not present

## 2014-06-02 DIAGNOSIS — M6281 Muscle weakness (generalized): Secondary | ICD-10-CM | POA: Diagnosis not present

## 2014-06-04 DIAGNOSIS — E785 Hyperlipidemia, unspecified: Secondary | ICD-10-CM | POA: Diagnosis not present

## 2014-06-04 DIAGNOSIS — S52502S Unspecified fracture of the lower end of left radius, sequela: Secondary | ICD-10-CM | POA: Diagnosis not present

## 2014-06-04 DIAGNOSIS — Z96642 Presence of left artificial hip joint: Secondary | ICD-10-CM | POA: Diagnosis not present

## 2014-06-04 DIAGNOSIS — I10 Essential (primary) hypertension: Secondary | ICD-10-CM | POA: Diagnosis not present

## 2014-06-04 DIAGNOSIS — Z471 Aftercare following joint replacement surgery: Secondary | ICD-10-CM | POA: Diagnosis not present

## 2014-06-04 DIAGNOSIS — D699 Hemorrhagic condition, unspecified: Secondary | ICD-10-CM | POA: Diagnosis not present

## 2014-06-04 DIAGNOSIS — I482 Chronic atrial fibrillation: Secondary | ICD-10-CM | POA: Diagnosis not present

## 2014-06-04 DIAGNOSIS — M6281 Muscle weakness (generalized): Secondary | ICD-10-CM | POA: Diagnosis not present

## 2014-06-04 DIAGNOSIS — I2510 Atherosclerotic heart disease of native coronary artery without angina pectoris: Secondary | ICD-10-CM | POA: Diagnosis not present

## 2014-06-04 DIAGNOSIS — S72002S Fracture of unspecified part of neck of left femur, sequela: Secondary | ICD-10-CM | POA: Diagnosis not present

## 2014-06-04 DIAGNOSIS — R278 Other lack of coordination: Secondary | ICD-10-CM | POA: Diagnosis not present

## 2014-06-05 DIAGNOSIS — E785 Hyperlipidemia, unspecified: Secondary | ICD-10-CM | POA: Diagnosis not present

## 2014-06-05 DIAGNOSIS — R278 Other lack of coordination: Secondary | ICD-10-CM | POA: Diagnosis not present

## 2014-06-05 DIAGNOSIS — Z471 Aftercare following joint replacement surgery: Secondary | ICD-10-CM | POA: Diagnosis not present

## 2014-06-05 DIAGNOSIS — I2510 Atherosclerotic heart disease of native coronary artery without angina pectoris: Secondary | ICD-10-CM | POA: Diagnosis not present

## 2014-06-05 DIAGNOSIS — I10 Essential (primary) hypertension: Secondary | ICD-10-CM | POA: Diagnosis not present

## 2014-06-05 DIAGNOSIS — I482 Chronic atrial fibrillation: Secondary | ICD-10-CM | POA: Diagnosis not present

## 2014-06-05 DIAGNOSIS — D699 Hemorrhagic condition, unspecified: Secondary | ICD-10-CM | POA: Diagnosis not present

## 2014-06-05 DIAGNOSIS — S52502S Unspecified fracture of the lower end of left radius, sequela: Secondary | ICD-10-CM | POA: Diagnosis not present

## 2014-06-05 DIAGNOSIS — S72002S Fracture of unspecified part of neck of left femur, sequela: Secondary | ICD-10-CM | POA: Diagnosis not present

## 2014-06-05 DIAGNOSIS — M6281 Muscle weakness (generalized): Secondary | ICD-10-CM | POA: Diagnosis not present

## 2014-06-05 DIAGNOSIS — Z96642 Presence of left artificial hip joint: Secondary | ICD-10-CM | POA: Diagnosis not present

## 2014-06-08 DIAGNOSIS — Z471 Aftercare following joint replacement surgery: Secondary | ICD-10-CM | POA: Diagnosis not present

## 2014-06-08 DIAGNOSIS — S52502S Unspecified fracture of the lower end of left radius, sequela: Secondary | ICD-10-CM | POA: Diagnosis not present

## 2014-06-08 DIAGNOSIS — E785 Hyperlipidemia, unspecified: Secondary | ICD-10-CM | POA: Diagnosis not present

## 2014-06-08 DIAGNOSIS — M6281 Muscle weakness (generalized): Secondary | ICD-10-CM | POA: Diagnosis not present

## 2014-06-08 DIAGNOSIS — I2510 Atherosclerotic heart disease of native coronary artery without angina pectoris: Secondary | ICD-10-CM | POA: Diagnosis not present

## 2014-06-08 DIAGNOSIS — I10 Essential (primary) hypertension: Secondary | ICD-10-CM | POA: Diagnosis not present

## 2014-06-08 DIAGNOSIS — R278 Other lack of coordination: Secondary | ICD-10-CM | POA: Diagnosis not present

## 2014-06-08 DIAGNOSIS — Z96642 Presence of left artificial hip joint: Secondary | ICD-10-CM | POA: Diagnosis not present

## 2014-06-08 DIAGNOSIS — D699 Hemorrhagic condition, unspecified: Secondary | ICD-10-CM | POA: Diagnosis not present

## 2014-06-08 DIAGNOSIS — I482 Chronic atrial fibrillation: Secondary | ICD-10-CM | POA: Diagnosis not present

## 2014-06-08 DIAGNOSIS — S72002S Fracture of unspecified part of neck of left femur, sequela: Secondary | ICD-10-CM | POA: Diagnosis not present

## 2014-06-09 DIAGNOSIS — E785 Hyperlipidemia, unspecified: Secondary | ICD-10-CM | POA: Diagnosis not present

## 2014-06-09 DIAGNOSIS — M6281 Muscle weakness (generalized): Secondary | ICD-10-CM | POA: Diagnosis not present

## 2014-06-09 DIAGNOSIS — D699 Hemorrhagic condition, unspecified: Secondary | ICD-10-CM | POA: Diagnosis not present

## 2014-06-09 DIAGNOSIS — R278 Other lack of coordination: Secondary | ICD-10-CM | POA: Diagnosis not present

## 2014-06-09 DIAGNOSIS — S72002S Fracture of unspecified part of neck of left femur, sequela: Secondary | ICD-10-CM | POA: Diagnosis not present

## 2014-06-09 DIAGNOSIS — I482 Chronic atrial fibrillation: Secondary | ICD-10-CM | POA: Diagnosis not present

## 2014-06-09 DIAGNOSIS — Z471 Aftercare following joint replacement surgery: Secondary | ICD-10-CM | POA: Diagnosis not present

## 2014-06-09 DIAGNOSIS — S52502S Unspecified fracture of the lower end of left radius, sequela: Secondary | ICD-10-CM | POA: Diagnosis not present

## 2014-06-09 DIAGNOSIS — Z96642 Presence of left artificial hip joint: Secondary | ICD-10-CM | POA: Diagnosis not present

## 2014-06-09 DIAGNOSIS — I10 Essential (primary) hypertension: Secondary | ICD-10-CM | POA: Diagnosis not present

## 2014-06-09 DIAGNOSIS — I2510 Atherosclerotic heart disease of native coronary artery without angina pectoris: Secondary | ICD-10-CM | POA: Diagnosis not present

## 2014-06-10 DIAGNOSIS — I482 Chronic atrial fibrillation: Secondary | ICD-10-CM | POA: Diagnosis not present

## 2014-06-10 DIAGNOSIS — M6281 Muscle weakness (generalized): Secondary | ICD-10-CM | POA: Diagnosis not present

## 2014-06-10 DIAGNOSIS — Z96642 Presence of left artificial hip joint: Secondary | ICD-10-CM | POA: Diagnosis not present

## 2014-06-10 DIAGNOSIS — D699 Hemorrhagic condition, unspecified: Secondary | ICD-10-CM | POA: Diagnosis not present

## 2014-06-10 DIAGNOSIS — S72002S Fracture of unspecified part of neck of left femur, sequela: Secondary | ICD-10-CM | POA: Diagnosis not present

## 2014-06-10 DIAGNOSIS — I2510 Atherosclerotic heart disease of native coronary artery without angina pectoris: Secondary | ICD-10-CM | POA: Diagnosis not present

## 2014-06-10 DIAGNOSIS — S52502S Unspecified fracture of the lower end of left radius, sequela: Secondary | ICD-10-CM | POA: Diagnosis not present

## 2014-06-10 DIAGNOSIS — E785 Hyperlipidemia, unspecified: Secondary | ICD-10-CM | POA: Diagnosis not present

## 2014-06-10 DIAGNOSIS — I10 Essential (primary) hypertension: Secondary | ICD-10-CM | POA: Diagnosis not present

## 2014-06-10 DIAGNOSIS — R278 Other lack of coordination: Secondary | ICD-10-CM | POA: Diagnosis not present

## 2014-06-10 DIAGNOSIS — Z471 Aftercare following joint replacement surgery: Secondary | ICD-10-CM | POA: Diagnosis not present

## 2014-06-11 DIAGNOSIS — Z96642 Presence of left artificial hip joint: Secondary | ICD-10-CM | POA: Diagnosis not present

## 2014-06-11 DIAGNOSIS — S72002S Fracture of unspecified part of neck of left femur, sequela: Secondary | ICD-10-CM | POA: Diagnosis not present

## 2014-06-11 DIAGNOSIS — I10 Essential (primary) hypertension: Secondary | ICD-10-CM | POA: Diagnosis not present

## 2014-06-11 DIAGNOSIS — S52502S Unspecified fracture of the lower end of left radius, sequela: Secondary | ICD-10-CM | POA: Diagnosis not present

## 2014-06-11 DIAGNOSIS — I2510 Atherosclerotic heart disease of native coronary artery without angina pectoris: Secondary | ICD-10-CM | POA: Diagnosis not present

## 2014-06-11 DIAGNOSIS — Z471 Aftercare following joint replacement surgery: Secondary | ICD-10-CM | POA: Diagnosis not present

## 2014-06-11 DIAGNOSIS — E785 Hyperlipidemia, unspecified: Secondary | ICD-10-CM | POA: Diagnosis not present

## 2014-06-11 DIAGNOSIS — M6281 Muscle weakness (generalized): Secondary | ICD-10-CM | POA: Diagnosis not present

## 2014-06-11 DIAGNOSIS — I482 Chronic atrial fibrillation: Secondary | ICD-10-CM | POA: Diagnosis not present

## 2014-06-11 DIAGNOSIS — R278 Other lack of coordination: Secondary | ICD-10-CM | POA: Diagnosis not present

## 2014-06-11 DIAGNOSIS — D699 Hemorrhagic condition, unspecified: Secondary | ICD-10-CM | POA: Diagnosis not present

## 2014-06-12 DIAGNOSIS — S72002S Fracture of unspecified part of neck of left femur, sequela: Secondary | ICD-10-CM | POA: Diagnosis not present

## 2014-06-12 DIAGNOSIS — R278 Other lack of coordination: Secondary | ICD-10-CM | POA: Diagnosis not present

## 2014-06-12 DIAGNOSIS — I10 Essential (primary) hypertension: Secondary | ICD-10-CM | POA: Diagnosis not present

## 2014-06-12 DIAGNOSIS — D699 Hemorrhagic condition, unspecified: Secondary | ICD-10-CM | POA: Diagnosis not present

## 2014-06-12 DIAGNOSIS — E785 Hyperlipidemia, unspecified: Secondary | ICD-10-CM | POA: Diagnosis not present

## 2014-06-12 DIAGNOSIS — S52502S Unspecified fracture of the lower end of left radius, sequela: Secondary | ICD-10-CM | POA: Diagnosis not present

## 2014-06-12 DIAGNOSIS — I2510 Atherosclerotic heart disease of native coronary artery without angina pectoris: Secondary | ICD-10-CM | POA: Diagnosis not present

## 2014-06-12 DIAGNOSIS — Z96642 Presence of left artificial hip joint: Secondary | ICD-10-CM | POA: Diagnosis not present

## 2014-06-12 DIAGNOSIS — Z471 Aftercare following joint replacement surgery: Secondary | ICD-10-CM | POA: Diagnosis not present

## 2014-06-12 DIAGNOSIS — I482 Chronic atrial fibrillation: Secondary | ICD-10-CM | POA: Diagnosis not present

## 2014-06-12 DIAGNOSIS — M6281 Muscle weakness (generalized): Secondary | ICD-10-CM | POA: Diagnosis not present

## 2014-06-15 DIAGNOSIS — S52502S Unspecified fracture of the lower end of left radius, sequela: Secondary | ICD-10-CM | POA: Diagnosis not present

## 2014-06-15 DIAGNOSIS — R278 Other lack of coordination: Secondary | ICD-10-CM | POA: Diagnosis not present

## 2014-06-15 DIAGNOSIS — S72002S Fracture of unspecified part of neck of left femur, sequela: Secondary | ICD-10-CM | POA: Diagnosis not present

## 2014-06-15 DIAGNOSIS — I482 Chronic atrial fibrillation: Secondary | ICD-10-CM | POA: Diagnosis not present

## 2014-06-15 DIAGNOSIS — E785 Hyperlipidemia, unspecified: Secondary | ICD-10-CM | POA: Diagnosis not present

## 2014-06-15 DIAGNOSIS — D699 Hemorrhagic condition, unspecified: Secondary | ICD-10-CM | POA: Diagnosis not present

## 2014-06-15 DIAGNOSIS — M6281 Muscle weakness (generalized): Secondary | ICD-10-CM | POA: Diagnosis not present

## 2014-06-15 DIAGNOSIS — I10 Essential (primary) hypertension: Secondary | ICD-10-CM | POA: Diagnosis not present

## 2014-06-15 DIAGNOSIS — Z96642 Presence of left artificial hip joint: Secondary | ICD-10-CM | POA: Diagnosis not present

## 2014-06-15 DIAGNOSIS — Z471 Aftercare following joint replacement surgery: Secondary | ICD-10-CM | POA: Diagnosis not present

## 2014-06-15 DIAGNOSIS — I2510 Atherosclerotic heart disease of native coronary artery without angina pectoris: Secondary | ICD-10-CM | POA: Diagnosis not present

## 2014-06-16 DIAGNOSIS — E785 Hyperlipidemia, unspecified: Secondary | ICD-10-CM | POA: Diagnosis not present

## 2014-06-16 DIAGNOSIS — M6281 Muscle weakness (generalized): Secondary | ICD-10-CM | POA: Diagnosis not present

## 2014-06-16 DIAGNOSIS — I10 Essential (primary) hypertension: Secondary | ICD-10-CM | POA: Diagnosis not present

## 2014-06-16 DIAGNOSIS — I482 Chronic atrial fibrillation: Secondary | ICD-10-CM | POA: Diagnosis not present

## 2014-06-16 DIAGNOSIS — R278 Other lack of coordination: Secondary | ICD-10-CM | POA: Diagnosis not present

## 2014-06-16 DIAGNOSIS — Z471 Aftercare following joint replacement surgery: Secondary | ICD-10-CM | POA: Diagnosis not present

## 2014-06-16 DIAGNOSIS — D699 Hemorrhagic condition, unspecified: Secondary | ICD-10-CM | POA: Diagnosis not present

## 2014-06-16 DIAGNOSIS — Z96642 Presence of left artificial hip joint: Secondary | ICD-10-CM | POA: Diagnosis not present

## 2014-06-16 DIAGNOSIS — S52502S Unspecified fracture of the lower end of left radius, sequela: Secondary | ICD-10-CM | POA: Diagnosis not present

## 2014-06-16 DIAGNOSIS — I2510 Atherosclerotic heart disease of native coronary artery without angina pectoris: Secondary | ICD-10-CM | POA: Diagnosis not present

## 2014-06-16 DIAGNOSIS — S72002S Fracture of unspecified part of neck of left femur, sequela: Secondary | ICD-10-CM | POA: Diagnosis not present

## 2014-06-17 DIAGNOSIS — S52572D Other intraarticular fracture of lower end of left radius, subsequent encounter for closed fracture with routine healing: Secondary | ICD-10-CM | POA: Diagnosis not present

## 2014-06-18 DIAGNOSIS — M6281 Muscle weakness (generalized): Secondary | ICD-10-CM | POA: Diagnosis not present

## 2014-06-18 DIAGNOSIS — I2510 Atherosclerotic heart disease of native coronary artery without angina pectoris: Secondary | ICD-10-CM | POA: Diagnosis not present

## 2014-06-18 DIAGNOSIS — I482 Chronic atrial fibrillation: Secondary | ICD-10-CM | POA: Diagnosis not present

## 2014-06-18 DIAGNOSIS — R278 Other lack of coordination: Secondary | ICD-10-CM | POA: Diagnosis not present

## 2014-06-18 DIAGNOSIS — S72002S Fracture of unspecified part of neck of left femur, sequela: Secondary | ICD-10-CM | POA: Diagnosis not present

## 2014-06-18 DIAGNOSIS — Z96642 Presence of left artificial hip joint: Secondary | ICD-10-CM | POA: Diagnosis not present

## 2014-06-18 DIAGNOSIS — I10 Essential (primary) hypertension: Secondary | ICD-10-CM | POA: Diagnosis not present

## 2014-06-18 DIAGNOSIS — E785 Hyperlipidemia, unspecified: Secondary | ICD-10-CM | POA: Diagnosis not present

## 2014-06-18 DIAGNOSIS — D699 Hemorrhagic condition, unspecified: Secondary | ICD-10-CM | POA: Diagnosis not present

## 2014-06-18 DIAGNOSIS — Z471 Aftercare following joint replacement surgery: Secondary | ICD-10-CM | POA: Diagnosis not present

## 2014-06-18 DIAGNOSIS — S52502S Unspecified fracture of the lower end of left radius, sequela: Secondary | ICD-10-CM | POA: Diagnosis not present

## 2014-06-19 DIAGNOSIS — Z471 Aftercare following joint replacement surgery: Secondary | ICD-10-CM | POA: Diagnosis not present

## 2014-06-19 DIAGNOSIS — E785 Hyperlipidemia, unspecified: Secondary | ICD-10-CM | POA: Diagnosis not present

## 2014-06-19 DIAGNOSIS — I482 Chronic atrial fibrillation: Secondary | ICD-10-CM | POA: Diagnosis not present

## 2014-06-19 DIAGNOSIS — R278 Other lack of coordination: Secondary | ICD-10-CM | POA: Diagnosis not present

## 2014-06-19 DIAGNOSIS — S52502S Unspecified fracture of the lower end of left radius, sequela: Secondary | ICD-10-CM | POA: Diagnosis not present

## 2014-06-19 DIAGNOSIS — S72002S Fracture of unspecified part of neck of left femur, sequela: Secondary | ICD-10-CM | POA: Diagnosis not present

## 2014-06-19 DIAGNOSIS — I2510 Atherosclerotic heart disease of native coronary artery without angina pectoris: Secondary | ICD-10-CM | POA: Diagnosis not present

## 2014-06-19 DIAGNOSIS — D699 Hemorrhagic condition, unspecified: Secondary | ICD-10-CM | POA: Diagnosis not present

## 2014-06-19 DIAGNOSIS — I10 Essential (primary) hypertension: Secondary | ICD-10-CM | POA: Diagnosis not present

## 2014-06-19 DIAGNOSIS — Z96642 Presence of left artificial hip joint: Secondary | ICD-10-CM | POA: Diagnosis not present

## 2014-06-19 DIAGNOSIS — M6281 Muscle weakness (generalized): Secondary | ICD-10-CM | POA: Diagnosis not present

## 2014-06-22 DIAGNOSIS — D699 Hemorrhagic condition, unspecified: Secondary | ICD-10-CM | POA: Diagnosis not present

## 2014-06-22 DIAGNOSIS — R278 Other lack of coordination: Secondary | ICD-10-CM | POA: Diagnosis not present

## 2014-06-22 DIAGNOSIS — S72002S Fracture of unspecified part of neck of left femur, sequela: Secondary | ICD-10-CM | POA: Diagnosis not present

## 2014-06-22 DIAGNOSIS — I10 Essential (primary) hypertension: Secondary | ICD-10-CM | POA: Diagnosis not present

## 2014-06-22 DIAGNOSIS — E785 Hyperlipidemia, unspecified: Secondary | ICD-10-CM | POA: Diagnosis not present

## 2014-06-22 DIAGNOSIS — I2510 Atherosclerotic heart disease of native coronary artery without angina pectoris: Secondary | ICD-10-CM | POA: Diagnosis not present

## 2014-06-22 DIAGNOSIS — M6281 Muscle weakness (generalized): Secondary | ICD-10-CM | POA: Diagnosis not present

## 2014-06-22 DIAGNOSIS — I482 Chronic atrial fibrillation: Secondary | ICD-10-CM | POA: Diagnosis not present

## 2014-06-22 DIAGNOSIS — Z471 Aftercare following joint replacement surgery: Secondary | ICD-10-CM | POA: Diagnosis not present

## 2014-06-22 DIAGNOSIS — Z96642 Presence of left artificial hip joint: Secondary | ICD-10-CM | POA: Diagnosis not present

## 2014-06-22 DIAGNOSIS — S52502S Unspecified fracture of the lower end of left radius, sequela: Secondary | ICD-10-CM | POA: Diagnosis not present

## 2014-06-23 DIAGNOSIS — Z471 Aftercare following joint replacement surgery: Secondary | ICD-10-CM | POA: Diagnosis not present

## 2014-06-23 DIAGNOSIS — Z96642 Presence of left artificial hip joint: Secondary | ICD-10-CM | POA: Diagnosis not present

## 2014-06-23 DIAGNOSIS — R278 Other lack of coordination: Secondary | ICD-10-CM | POA: Diagnosis not present

## 2014-06-23 DIAGNOSIS — S52502S Unspecified fracture of the lower end of left radius, sequela: Secondary | ICD-10-CM | POA: Diagnosis not present

## 2014-06-23 DIAGNOSIS — I2510 Atherosclerotic heart disease of native coronary artery without angina pectoris: Secondary | ICD-10-CM | POA: Diagnosis not present

## 2014-06-23 DIAGNOSIS — E785 Hyperlipidemia, unspecified: Secondary | ICD-10-CM | POA: Diagnosis not present

## 2014-06-23 DIAGNOSIS — I482 Chronic atrial fibrillation: Secondary | ICD-10-CM | POA: Diagnosis not present

## 2014-06-23 DIAGNOSIS — M6281 Muscle weakness (generalized): Secondary | ICD-10-CM | POA: Diagnosis not present

## 2014-06-23 DIAGNOSIS — I10 Essential (primary) hypertension: Secondary | ICD-10-CM | POA: Diagnosis not present

## 2014-06-23 DIAGNOSIS — D699 Hemorrhagic condition, unspecified: Secondary | ICD-10-CM | POA: Diagnosis not present

## 2014-06-23 DIAGNOSIS — S72002S Fracture of unspecified part of neck of left femur, sequela: Secondary | ICD-10-CM | POA: Diagnosis not present

## 2014-06-24 DIAGNOSIS — I10 Essential (primary) hypertension: Secondary | ICD-10-CM | POA: Diagnosis not present

## 2014-06-24 DIAGNOSIS — M6281 Muscle weakness (generalized): Secondary | ICD-10-CM | POA: Diagnosis not present

## 2014-06-24 DIAGNOSIS — R278 Other lack of coordination: Secondary | ICD-10-CM | POA: Diagnosis not present

## 2014-06-24 DIAGNOSIS — S72002S Fracture of unspecified part of neck of left femur, sequela: Secondary | ICD-10-CM | POA: Diagnosis not present

## 2014-06-24 DIAGNOSIS — S52502S Unspecified fracture of the lower end of left radius, sequela: Secondary | ICD-10-CM | POA: Diagnosis not present

## 2014-06-24 DIAGNOSIS — I2510 Atherosclerotic heart disease of native coronary artery without angina pectoris: Secondary | ICD-10-CM | POA: Diagnosis not present

## 2014-06-24 DIAGNOSIS — Z471 Aftercare following joint replacement surgery: Secondary | ICD-10-CM | POA: Diagnosis not present

## 2014-06-24 DIAGNOSIS — D696 Thrombocytopenia, unspecified: Secondary | ICD-10-CM | POA: Diagnosis not present

## 2014-06-24 DIAGNOSIS — I482 Chronic atrial fibrillation: Secondary | ICD-10-CM | POA: Diagnosis not present

## 2014-06-24 DIAGNOSIS — E785 Hyperlipidemia, unspecified: Secondary | ICD-10-CM | POA: Diagnosis not present

## 2014-06-24 DIAGNOSIS — R7309 Other abnormal glucose: Secondary | ICD-10-CM | POA: Diagnosis not present

## 2014-06-24 DIAGNOSIS — D699 Hemorrhagic condition, unspecified: Secondary | ICD-10-CM | POA: Diagnosis not present

## 2014-06-24 DIAGNOSIS — Z96642 Presence of left artificial hip joint: Secondary | ICD-10-CM | POA: Diagnosis not present

## 2014-06-25 ENCOUNTER — Other Ambulatory Visit: Payer: Self-pay | Admitting: Adult Health

## 2014-06-26 DIAGNOSIS — I482 Chronic atrial fibrillation: Secondary | ICD-10-CM | POA: Diagnosis not present

## 2014-06-26 DIAGNOSIS — Z471 Aftercare following joint replacement surgery: Secondary | ICD-10-CM | POA: Diagnosis not present

## 2014-06-26 DIAGNOSIS — Z96642 Presence of left artificial hip joint: Secondary | ICD-10-CM | POA: Diagnosis not present

## 2014-06-26 DIAGNOSIS — I2510 Atherosclerotic heart disease of native coronary artery without angina pectoris: Secondary | ICD-10-CM | POA: Diagnosis not present

## 2014-06-26 DIAGNOSIS — S52502S Unspecified fracture of the lower end of left radius, sequela: Secondary | ICD-10-CM | POA: Diagnosis not present

## 2014-06-26 DIAGNOSIS — S72002S Fracture of unspecified part of neck of left femur, sequela: Secondary | ICD-10-CM | POA: Diagnosis not present

## 2014-06-26 DIAGNOSIS — M6281 Muscle weakness (generalized): Secondary | ICD-10-CM | POA: Diagnosis not present

## 2014-06-26 DIAGNOSIS — R278 Other lack of coordination: Secondary | ICD-10-CM | POA: Diagnosis not present

## 2014-06-26 DIAGNOSIS — I10 Essential (primary) hypertension: Secondary | ICD-10-CM | POA: Diagnosis not present

## 2014-06-26 DIAGNOSIS — D699 Hemorrhagic condition, unspecified: Secondary | ICD-10-CM | POA: Diagnosis not present

## 2014-06-26 DIAGNOSIS — E785 Hyperlipidemia, unspecified: Secondary | ICD-10-CM | POA: Diagnosis not present

## 2014-06-29 DIAGNOSIS — D485 Neoplasm of uncertain behavior of skin: Secondary | ICD-10-CM | POA: Diagnosis not present

## 2014-06-29 DIAGNOSIS — R7309 Other abnormal glucose: Secondary | ICD-10-CM | POA: Diagnosis not present

## 2014-06-30 DIAGNOSIS — I2510 Atherosclerotic heart disease of native coronary artery without angina pectoris: Secondary | ICD-10-CM | POA: Diagnosis not present

## 2014-06-30 DIAGNOSIS — I482 Chronic atrial fibrillation: Secondary | ICD-10-CM | POA: Diagnosis not present

## 2014-06-30 DIAGNOSIS — S52502S Unspecified fracture of the lower end of left radius, sequela: Secondary | ICD-10-CM | POA: Diagnosis not present

## 2014-06-30 DIAGNOSIS — M6281 Muscle weakness (generalized): Secondary | ICD-10-CM | POA: Diagnosis not present

## 2014-06-30 DIAGNOSIS — D699 Hemorrhagic condition, unspecified: Secondary | ICD-10-CM | POA: Diagnosis not present

## 2014-06-30 DIAGNOSIS — E785 Hyperlipidemia, unspecified: Secondary | ICD-10-CM | POA: Diagnosis not present

## 2014-06-30 DIAGNOSIS — Z96642 Presence of left artificial hip joint: Secondary | ICD-10-CM | POA: Diagnosis not present

## 2014-06-30 DIAGNOSIS — I10 Essential (primary) hypertension: Secondary | ICD-10-CM | POA: Diagnosis not present

## 2014-06-30 DIAGNOSIS — R278 Other lack of coordination: Secondary | ICD-10-CM | POA: Diagnosis not present

## 2014-06-30 DIAGNOSIS — S72002S Fracture of unspecified part of neck of left femur, sequela: Secondary | ICD-10-CM | POA: Diagnosis not present

## 2014-06-30 DIAGNOSIS — Z471 Aftercare following joint replacement surgery: Secondary | ICD-10-CM | POA: Diagnosis not present

## 2014-07-01 DIAGNOSIS — I10 Essential (primary) hypertension: Secondary | ICD-10-CM | POA: Diagnosis not present

## 2014-07-01 DIAGNOSIS — S72002S Fracture of unspecified part of neck of left femur, sequela: Secondary | ICD-10-CM | POA: Diagnosis not present

## 2014-07-01 DIAGNOSIS — M6281 Muscle weakness (generalized): Secondary | ICD-10-CM | POA: Diagnosis not present

## 2014-07-01 DIAGNOSIS — I2510 Atherosclerotic heart disease of native coronary artery without angina pectoris: Secondary | ICD-10-CM | POA: Diagnosis not present

## 2014-07-01 DIAGNOSIS — Z96642 Presence of left artificial hip joint: Secondary | ICD-10-CM | POA: Diagnosis not present

## 2014-07-01 DIAGNOSIS — I482 Chronic atrial fibrillation: Secondary | ICD-10-CM | POA: Diagnosis not present

## 2014-07-01 DIAGNOSIS — R278 Other lack of coordination: Secondary | ICD-10-CM | POA: Diagnosis not present

## 2014-07-01 DIAGNOSIS — S52502S Unspecified fracture of the lower end of left radius, sequela: Secondary | ICD-10-CM | POA: Diagnosis not present

## 2014-07-01 DIAGNOSIS — Z471 Aftercare following joint replacement surgery: Secondary | ICD-10-CM | POA: Diagnosis not present

## 2014-07-01 DIAGNOSIS — E785 Hyperlipidemia, unspecified: Secondary | ICD-10-CM | POA: Diagnosis not present

## 2014-07-01 DIAGNOSIS — D699 Hemorrhagic condition, unspecified: Secondary | ICD-10-CM | POA: Diagnosis not present

## 2014-07-02 DIAGNOSIS — Z96642 Presence of left artificial hip joint: Secondary | ICD-10-CM | POA: Diagnosis not present

## 2014-07-02 DIAGNOSIS — E785 Hyperlipidemia, unspecified: Secondary | ICD-10-CM | POA: Diagnosis not present

## 2014-07-02 DIAGNOSIS — I2510 Atherosclerotic heart disease of native coronary artery without angina pectoris: Secondary | ICD-10-CM | POA: Diagnosis not present

## 2014-07-02 DIAGNOSIS — S72002S Fracture of unspecified part of neck of left femur, sequela: Secondary | ICD-10-CM | POA: Diagnosis not present

## 2014-07-02 DIAGNOSIS — M6281 Muscle weakness (generalized): Secondary | ICD-10-CM | POA: Diagnosis not present

## 2014-07-02 DIAGNOSIS — I482 Chronic atrial fibrillation: Secondary | ICD-10-CM | POA: Diagnosis not present

## 2014-07-02 DIAGNOSIS — D699 Hemorrhagic condition, unspecified: Secondary | ICD-10-CM | POA: Diagnosis not present

## 2014-07-02 DIAGNOSIS — Z471 Aftercare following joint replacement surgery: Secondary | ICD-10-CM | POA: Diagnosis not present

## 2014-07-02 DIAGNOSIS — S52502S Unspecified fracture of the lower end of left radius, sequela: Secondary | ICD-10-CM | POA: Diagnosis not present

## 2014-07-02 DIAGNOSIS — I10 Essential (primary) hypertension: Secondary | ICD-10-CM | POA: Diagnosis not present

## 2014-07-02 DIAGNOSIS — R278 Other lack of coordination: Secondary | ICD-10-CM | POA: Diagnosis not present

## 2014-07-03 DIAGNOSIS — Z471 Aftercare following joint replacement surgery: Secondary | ICD-10-CM | POA: Diagnosis not present

## 2014-07-03 DIAGNOSIS — D699 Hemorrhagic condition, unspecified: Secondary | ICD-10-CM | POA: Diagnosis not present

## 2014-07-03 DIAGNOSIS — R278 Other lack of coordination: Secondary | ICD-10-CM | POA: Diagnosis not present

## 2014-07-03 DIAGNOSIS — Z96642 Presence of left artificial hip joint: Secondary | ICD-10-CM | POA: Diagnosis not present

## 2014-07-03 DIAGNOSIS — E785 Hyperlipidemia, unspecified: Secondary | ICD-10-CM | POA: Diagnosis not present

## 2014-07-03 DIAGNOSIS — I2510 Atherosclerotic heart disease of native coronary artery without angina pectoris: Secondary | ICD-10-CM | POA: Diagnosis not present

## 2014-07-03 DIAGNOSIS — S52502S Unspecified fracture of the lower end of left radius, sequela: Secondary | ICD-10-CM | POA: Diagnosis not present

## 2014-07-03 DIAGNOSIS — I482 Chronic atrial fibrillation: Secondary | ICD-10-CM | POA: Diagnosis not present

## 2014-07-03 DIAGNOSIS — I10 Essential (primary) hypertension: Secondary | ICD-10-CM | POA: Diagnosis not present

## 2014-07-03 DIAGNOSIS — M6281 Muscle weakness (generalized): Secondary | ICD-10-CM | POA: Diagnosis not present

## 2014-07-03 DIAGNOSIS — S72002S Fracture of unspecified part of neck of left femur, sequela: Secondary | ICD-10-CM | POA: Diagnosis not present

## 2014-07-06 DIAGNOSIS — D699 Hemorrhagic condition, unspecified: Secondary | ICD-10-CM | POA: Diagnosis not present

## 2014-07-06 DIAGNOSIS — R278 Other lack of coordination: Secondary | ICD-10-CM | POA: Diagnosis not present

## 2014-07-06 DIAGNOSIS — M6281 Muscle weakness (generalized): Secondary | ICD-10-CM | POA: Diagnosis not present

## 2014-07-06 DIAGNOSIS — Z96642 Presence of left artificial hip joint: Secondary | ICD-10-CM | POA: Diagnosis not present

## 2014-07-06 DIAGNOSIS — S72002S Fracture of unspecified part of neck of left femur, sequela: Secondary | ICD-10-CM | POA: Diagnosis not present

## 2014-07-06 DIAGNOSIS — I482 Chronic atrial fibrillation: Secondary | ICD-10-CM | POA: Diagnosis not present

## 2014-07-06 DIAGNOSIS — I10 Essential (primary) hypertension: Secondary | ICD-10-CM | POA: Diagnosis not present

## 2014-07-06 DIAGNOSIS — E785 Hyperlipidemia, unspecified: Secondary | ICD-10-CM | POA: Diagnosis not present

## 2014-07-06 DIAGNOSIS — S52502S Unspecified fracture of the lower end of left radius, sequela: Secondary | ICD-10-CM | POA: Diagnosis not present

## 2014-07-06 DIAGNOSIS — I2510 Atherosclerotic heart disease of native coronary artery without angina pectoris: Secondary | ICD-10-CM | POA: Diagnosis not present

## 2014-07-06 DIAGNOSIS — Z471 Aftercare following joint replacement surgery: Secondary | ICD-10-CM | POA: Diagnosis not present

## 2014-07-07 DIAGNOSIS — Z471 Aftercare following joint replacement surgery: Secondary | ICD-10-CM | POA: Diagnosis not present

## 2014-07-07 DIAGNOSIS — R278 Other lack of coordination: Secondary | ICD-10-CM | POA: Diagnosis not present

## 2014-07-07 DIAGNOSIS — Z96642 Presence of left artificial hip joint: Secondary | ICD-10-CM | POA: Diagnosis not present

## 2014-07-07 DIAGNOSIS — M6281 Muscle weakness (generalized): Secondary | ICD-10-CM | POA: Diagnosis not present

## 2014-07-07 DIAGNOSIS — I482 Chronic atrial fibrillation: Secondary | ICD-10-CM | POA: Diagnosis not present

## 2014-07-07 DIAGNOSIS — D699 Hemorrhagic condition, unspecified: Secondary | ICD-10-CM | POA: Diagnosis not present

## 2014-07-07 DIAGNOSIS — E785 Hyperlipidemia, unspecified: Secondary | ICD-10-CM | POA: Diagnosis not present

## 2014-07-07 DIAGNOSIS — S52502S Unspecified fracture of the lower end of left radius, sequela: Secondary | ICD-10-CM | POA: Diagnosis not present

## 2014-07-07 DIAGNOSIS — I2510 Atherosclerotic heart disease of native coronary artery without angina pectoris: Secondary | ICD-10-CM | POA: Diagnosis not present

## 2014-07-07 DIAGNOSIS — I10 Essential (primary) hypertension: Secondary | ICD-10-CM | POA: Diagnosis not present

## 2014-07-07 DIAGNOSIS — S72002S Fracture of unspecified part of neck of left femur, sequela: Secondary | ICD-10-CM | POA: Diagnosis not present

## 2014-07-08 DIAGNOSIS — M6281 Muscle weakness (generalized): Secondary | ICD-10-CM | POA: Diagnosis not present

## 2014-07-08 DIAGNOSIS — Z96642 Presence of left artificial hip joint: Secondary | ICD-10-CM | POA: Diagnosis not present

## 2014-07-08 DIAGNOSIS — I2510 Atherosclerotic heart disease of native coronary artery without angina pectoris: Secondary | ICD-10-CM | POA: Diagnosis not present

## 2014-07-08 DIAGNOSIS — D699 Hemorrhagic condition, unspecified: Secondary | ICD-10-CM | POA: Diagnosis not present

## 2014-07-08 DIAGNOSIS — I482 Chronic atrial fibrillation: Secondary | ICD-10-CM | POA: Diagnosis not present

## 2014-07-08 DIAGNOSIS — R278 Other lack of coordination: Secondary | ICD-10-CM | POA: Diagnosis not present

## 2014-07-08 DIAGNOSIS — I10 Essential (primary) hypertension: Secondary | ICD-10-CM | POA: Diagnosis not present

## 2014-07-08 DIAGNOSIS — Z471 Aftercare following joint replacement surgery: Secondary | ICD-10-CM | POA: Diagnosis not present

## 2014-07-08 DIAGNOSIS — S72002S Fracture of unspecified part of neck of left femur, sequela: Secondary | ICD-10-CM | POA: Diagnosis not present

## 2014-07-08 DIAGNOSIS — E785 Hyperlipidemia, unspecified: Secondary | ICD-10-CM | POA: Diagnosis not present

## 2014-07-08 DIAGNOSIS — S52502S Unspecified fracture of the lower end of left radius, sequela: Secondary | ICD-10-CM | POA: Diagnosis not present

## 2014-07-09 DIAGNOSIS — S52502S Unspecified fracture of the lower end of left radius, sequela: Secondary | ICD-10-CM | POA: Diagnosis not present

## 2014-07-09 DIAGNOSIS — M6281 Muscle weakness (generalized): Secondary | ICD-10-CM | POA: Diagnosis not present

## 2014-07-09 DIAGNOSIS — I10 Essential (primary) hypertension: Secondary | ICD-10-CM | POA: Diagnosis not present

## 2014-07-09 DIAGNOSIS — E785 Hyperlipidemia, unspecified: Secondary | ICD-10-CM | POA: Diagnosis not present

## 2014-07-09 DIAGNOSIS — Z471 Aftercare following joint replacement surgery: Secondary | ICD-10-CM | POA: Diagnosis not present

## 2014-07-09 DIAGNOSIS — Z96642 Presence of left artificial hip joint: Secondary | ICD-10-CM | POA: Diagnosis not present

## 2014-07-09 DIAGNOSIS — D699 Hemorrhagic condition, unspecified: Secondary | ICD-10-CM | POA: Diagnosis not present

## 2014-07-09 DIAGNOSIS — I2510 Atherosclerotic heart disease of native coronary artery without angina pectoris: Secondary | ICD-10-CM | POA: Diagnosis not present

## 2014-07-09 DIAGNOSIS — S72002S Fracture of unspecified part of neck of left femur, sequela: Secondary | ICD-10-CM | POA: Diagnosis not present

## 2014-07-09 DIAGNOSIS — R278 Other lack of coordination: Secondary | ICD-10-CM | POA: Diagnosis not present

## 2014-07-09 DIAGNOSIS — I482 Chronic atrial fibrillation: Secondary | ICD-10-CM | POA: Diagnosis not present

## 2014-07-13 DIAGNOSIS — E785 Hyperlipidemia, unspecified: Secondary | ICD-10-CM | POA: Diagnosis not present

## 2014-07-13 DIAGNOSIS — I2510 Atherosclerotic heart disease of native coronary artery without angina pectoris: Secondary | ICD-10-CM | POA: Diagnosis not present

## 2014-07-13 DIAGNOSIS — Z96642 Presence of left artificial hip joint: Secondary | ICD-10-CM | POA: Diagnosis not present

## 2014-07-13 DIAGNOSIS — S72002S Fracture of unspecified part of neck of left femur, sequela: Secondary | ICD-10-CM | POA: Diagnosis not present

## 2014-07-13 DIAGNOSIS — Z471 Aftercare following joint replacement surgery: Secondary | ICD-10-CM | POA: Diagnosis not present

## 2014-07-13 DIAGNOSIS — R278 Other lack of coordination: Secondary | ICD-10-CM | POA: Diagnosis not present

## 2014-07-13 DIAGNOSIS — S52502S Unspecified fracture of the lower end of left radius, sequela: Secondary | ICD-10-CM | POA: Diagnosis not present

## 2014-07-13 DIAGNOSIS — D699 Hemorrhagic condition, unspecified: Secondary | ICD-10-CM | POA: Diagnosis not present

## 2014-07-13 DIAGNOSIS — I482 Chronic atrial fibrillation: Secondary | ICD-10-CM | POA: Diagnosis not present

## 2014-07-13 DIAGNOSIS — I10 Essential (primary) hypertension: Secondary | ICD-10-CM | POA: Diagnosis not present

## 2014-07-13 DIAGNOSIS — M6281 Muscle weakness (generalized): Secondary | ICD-10-CM | POA: Diagnosis not present

## 2014-07-15 DIAGNOSIS — D699 Hemorrhagic condition, unspecified: Secondary | ICD-10-CM | POA: Diagnosis not present

## 2014-07-15 DIAGNOSIS — R278 Other lack of coordination: Secondary | ICD-10-CM | POA: Diagnosis not present

## 2014-07-15 DIAGNOSIS — E785 Hyperlipidemia, unspecified: Secondary | ICD-10-CM | POA: Diagnosis not present

## 2014-07-15 DIAGNOSIS — Z96642 Presence of left artificial hip joint: Secondary | ICD-10-CM | POA: Diagnosis not present

## 2014-07-15 DIAGNOSIS — M6281 Muscle weakness (generalized): Secondary | ICD-10-CM | POA: Diagnosis not present

## 2014-07-15 DIAGNOSIS — S52502S Unspecified fracture of the lower end of left radius, sequela: Secondary | ICD-10-CM | POA: Diagnosis not present

## 2014-07-15 DIAGNOSIS — I482 Chronic atrial fibrillation: Secondary | ICD-10-CM | POA: Diagnosis not present

## 2014-07-15 DIAGNOSIS — Z471 Aftercare following joint replacement surgery: Secondary | ICD-10-CM | POA: Diagnosis not present

## 2014-07-15 DIAGNOSIS — I2510 Atherosclerotic heart disease of native coronary artery without angina pectoris: Secondary | ICD-10-CM | POA: Diagnosis not present

## 2014-07-15 DIAGNOSIS — S72002S Fracture of unspecified part of neck of left femur, sequela: Secondary | ICD-10-CM | POA: Diagnosis not present

## 2014-07-15 DIAGNOSIS — I10 Essential (primary) hypertension: Secondary | ICD-10-CM | POA: Diagnosis not present

## 2014-07-16 DIAGNOSIS — I2510 Atherosclerotic heart disease of native coronary artery without angina pectoris: Secondary | ICD-10-CM | POA: Diagnosis not present

## 2014-07-16 DIAGNOSIS — E785 Hyperlipidemia, unspecified: Secondary | ICD-10-CM | POA: Diagnosis not present

## 2014-07-16 DIAGNOSIS — D699 Hemorrhagic condition, unspecified: Secondary | ICD-10-CM | POA: Diagnosis not present

## 2014-07-16 DIAGNOSIS — S72002S Fracture of unspecified part of neck of left femur, sequela: Secondary | ICD-10-CM | POA: Diagnosis not present

## 2014-07-16 DIAGNOSIS — Z96642 Presence of left artificial hip joint: Secondary | ICD-10-CM | POA: Diagnosis not present

## 2014-07-16 DIAGNOSIS — R278 Other lack of coordination: Secondary | ICD-10-CM | POA: Diagnosis not present

## 2014-07-16 DIAGNOSIS — M6281 Muscle weakness (generalized): Secondary | ICD-10-CM | POA: Diagnosis not present

## 2014-07-16 DIAGNOSIS — I10 Essential (primary) hypertension: Secondary | ICD-10-CM | POA: Diagnosis not present

## 2014-07-16 DIAGNOSIS — I482 Chronic atrial fibrillation: Secondary | ICD-10-CM | POA: Diagnosis not present

## 2014-07-16 DIAGNOSIS — Z471 Aftercare following joint replacement surgery: Secondary | ICD-10-CM | POA: Diagnosis not present

## 2014-07-16 DIAGNOSIS — S52502S Unspecified fracture of the lower end of left radius, sequela: Secondary | ICD-10-CM | POA: Diagnosis not present

## 2014-07-20 DIAGNOSIS — H52203 Unspecified astigmatism, bilateral: Secondary | ICD-10-CM | POA: Diagnosis not present

## 2014-07-20 DIAGNOSIS — Z961 Presence of intraocular lens: Secondary | ICD-10-CM | POA: Diagnosis not present

## 2014-07-20 DIAGNOSIS — H3561 Retinal hemorrhage, right eye: Secondary | ICD-10-CM | POA: Diagnosis not present

## 2014-07-20 DIAGNOSIS — H5213 Myopia, bilateral: Secondary | ICD-10-CM | POA: Diagnosis not present

## 2014-07-21 DIAGNOSIS — D699 Hemorrhagic condition, unspecified: Secondary | ICD-10-CM | POA: Diagnosis not present

## 2014-07-21 DIAGNOSIS — S72002S Fracture of unspecified part of neck of left femur, sequela: Secondary | ICD-10-CM | POA: Diagnosis not present

## 2014-07-21 DIAGNOSIS — S52502S Unspecified fracture of the lower end of left radius, sequela: Secondary | ICD-10-CM | POA: Diagnosis not present

## 2014-07-21 DIAGNOSIS — E785 Hyperlipidemia, unspecified: Secondary | ICD-10-CM | POA: Diagnosis not present

## 2014-07-21 DIAGNOSIS — I482 Chronic atrial fibrillation: Secondary | ICD-10-CM | POA: Diagnosis not present

## 2014-07-21 DIAGNOSIS — Z471 Aftercare following joint replacement surgery: Secondary | ICD-10-CM | POA: Diagnosis not present

## 2014-07-21 DIAGNOSIS — I2510 Atherosclerotic heart disease of native coronary artery without angina pectoris: Secondary | ICD-10-CM | POA: Diagnosis not present

## 2014-07-21 DIAGNOSIS — M6281 Muscle weakness (generalized): Secondary | ICD-10-CM | POA: Diagnosis not present

## 2014-07-21 DIAGNOSIS — R278 Other lack of coordination: Secondary | ICD-10-CM | POA: Diagnosis not present

## 2014-07-21 DIAGNOSIS — I10 Essential (primary) hypertension: Secondary | ICD-10-CM | POA: Diagnosis not present

## 2014-07-21 DIAGNOSIS — Z96642 Presence of left artificial hip joint: Secondary | ICD-10-CM | POA: Diagnosis not present

## 2014-07-22 DIAGNOSIS — S72002S Fracture of unspecified part of neck of left femur, sequela: Secondary | ICD-10-CM | POA: Diagnosis not present

## 2014-07-22 DIAGNOSIS — I10 Essential (primary) hypertension: Secondary | ICD-10-CM | POA: Diagnosis not present

## 2014-07-22 DIAGNOSIS — E785 Hyperlipidemia, unspecified: Secondary | ICD-10-CM | POA: Diagnosis not present

## 2014-07-22 DIAGNOSIS — D699 Hemorrhagic condition, unspecified: Secondary | ICD-10-CM | POA: Diagnosis not present

## 2014-07-22 DIAGNOSIS — Z471 Aftercare following joint replacement surgery: Secondary | ICD-10-CM | POA: Diagnosis not present

## 2014-07-22 DIAGNOSIS — M6281 Muscle weakness (generalized): Secondary | ICD-10-CM | POA: Diagnosis not present

## 2014-07-22 DIAGNOSIS — R278 Other lack of coordination: Secondary | ICD-10-CM | POA: Diagnosis not present

## 2014-07-22 DIAGNOSIS — I2510 Atherosclerotic heart disease of native coronary artery without angina pectoris: Secondary | ICD-10-CM | POA: Diagnosis not present

## 2014-07-22 DIAGNOSIS — S52502S Unspecified fracture of the lower end of left radius, sequela: Secondary | ICD-10-CM | POA: Diagnosis not present

## 2014-07-22 DIAGNOSIS — Z96642 Presence of left artificial hip joint: Secondary | ICD-10-CM | POA: Diagnosis not present

## 2014-07-22 DIAGNOSIS — I482 Chronic atrial fibrillation: Secondary | ICD-10-CM | POA: Diagnosis not present

## 2014-10-12 DIAGNOSIS — I4891 Unspecified atrial fibrillation: Secondary | ICD-10-CM | POA: Diagnosis not present

## 2014-10-12 DIAGNOSIS — L570 Actinic keratosis: Secondary | ICD-10-CM | POA: Diagnosis not present

## 2014-11-09 DIAGNOSIS — Z23 Encounter for immunization: Secondary | ICD-10-CM | POA: Diagnosis not present

## 2014-11-09 DIAGNOSIS — R600 Localized edema: Secondary | ICD-10-CM | POA: Diagnosis not present

## 2014-11-13 DIAGNOSIS — M1712 Unilateral primary osteoarthritis, left knee: Secondary | ICD-10-CM | POA: Diagnosis not present

## 2014-11-16 DIAGNOSIS — I10 Essential (primary) hypertension: Secondary | ICD-10-CM | POA: Diagnosis not present

## 2014-11-16 DIAGNOSIS — R600 Localized edema: Secondary | ICD-10-CM | POA: Diagnosis not present

## 2014-11-18 DIAGNOSIS — R600 Localized edema: Secondary | ICD-10-CM | POA: Diagnosis not present

## 2014-12-25 DIAGNOSIS — M25562 Pain in left knee: Secondary | ICD-10-CM | POA: Diagnosis not present

## 2014-12-25 DIAGNOSIS — M1712 Unilateral primary osteoarthritis, left knee: Secondary | ICD-10-CM | POA: Diagnosis not present

## 2015-02-05 DIAGNOSIS — M1712 Unilateral primary osteoarthritis, left knee: Secondary | ICD-10-CM | POA: Diagnosis not present

## 2015-02-10 DIAGNOSIS — E119 Type 2 diabetes mellitus without complications: Secondary | ICD-10-CM | POA: Diagnosis not present

## 2015-02-10 DIAGNOSIS — I10 Essential (primary) hypertension: Secondary | ICD-10-CM | POA: Diagnosis not present

## 2015-02-10 DIAGNOSIS — E7800 Pure hypercholesterolemia, unspecified: Secondary | ICD-10-CM | POA: Diagnosis not present

## 2015-02-10 DIAGNOSIS — Z7901 Long term (current) use of anticoagulants: Secondary | ICD-10-CM | POA: Diagnosis not present

## 2015-02-10 DIAGNOSIS — D696 Thrombocytopenia, unspecified: Secondary | ICD-10-CM | POA: Diagnosis not present

## 2015-02-10 DIAGNOSIS — Z0000 Encounter for general adult medical examination without abnormal findings: Secondary | ICD-10-CM | POA: Diagnosis not present

## 2015-02-15 DIAGNOSIS — M25562 Pain in left knee: Secondary | ICD-10-CM | POA: Diagnosis not present

## 2015-02-15 DIAGNOSIS — M1712 Unilateral primary osteoarthritis, left knee: Secondary | ICD-10-CM | POA: Diagnosis not present

## 2015-02-23 DIAGNOSIS — M1712 Unilateral primary osteoarthritis, left knee: Secondary | ICD-10-CM | POA: Diagnosis not present

## 2015-03-18 DIAGNOSIS — H01004 Unspecified blepharitis left upper eyelid: Secondary | ICD-10-CM | POA: Diagnosis not present

## 2015-03-18 DIAGNOSIS — H01001 Unspecified blepharitis right upper eyelid: Secondary | ICD-10-CM | POA: Diagnosis not present

## 2015-03-18 DIAGNOSIS — H43813 Vitreous degeneration, bilateral: Secondary | ICD-10-CM | POA: Diagnosis not present

## 2015-03-18 DIAGNOSIS — H353132 Nonexudative age-related macular degeneration, bilateral, intermediate dry stage: Secondary | ICD-10-CM | POA: Diagnosis not present

## 2015-03-26 DIAGNOSIS — I10 Essential (primary) hypertension: Secondary | ICD-10-CM | POA: Diagnosis not present

## 2015-03-26 DIAGNOSIS — Z0000 Encounter for general adult medical examination without abnormal findings: Secondary | ICD-10-CM | POA: Diagnosis not present

## 2015-03-26 DIAGNOSIS — D692 Other nonthrombocytopenic purpura: Secondary | ICD-10-CM | POA: Diagnosis not present

## 2015-03-26 DIAGNOSIS — D696 Thrombocytopenia, unspecified: Secondary | ICD-10-CM | POA: Diagnosis not present

## 2015-04-09 DIAGNOSIS — M1712 Unilateral primary osteoarthritis, left knee: Secondary | ICD-10-CM | POA: Diagnosis not present

## 2015-09-03 DIAGNOSIS — M1712 Unilateral primary osteoarthritis, left knee: Secondary | ICD-10-CM | POA: Diagnosis not present

## 2015-09-09 DIAGNOSIS — M1712 Unilateral primary osteoarthritis, left knee: Secondary | ICD-10-CM | POA: Diagnosis not present

## 2015-09-16 DIAGNOSIS — M1712 Unilateral primary osteoarthritis, left knee: Secondary | ICD-10-CM | POA: Diagnosis not present

## 2015-09-24 IMAGING — CR DG CHEST 1V PORT SAME DAY
1 series · 1 of 1 positions shown · non-contrast
Comparison: 03/18/2014

CLINICAL DATA: Shortness of breath, chills

EXAM:
PORTABLE CHEST - 1 VIEW SAME DAY

[AP]
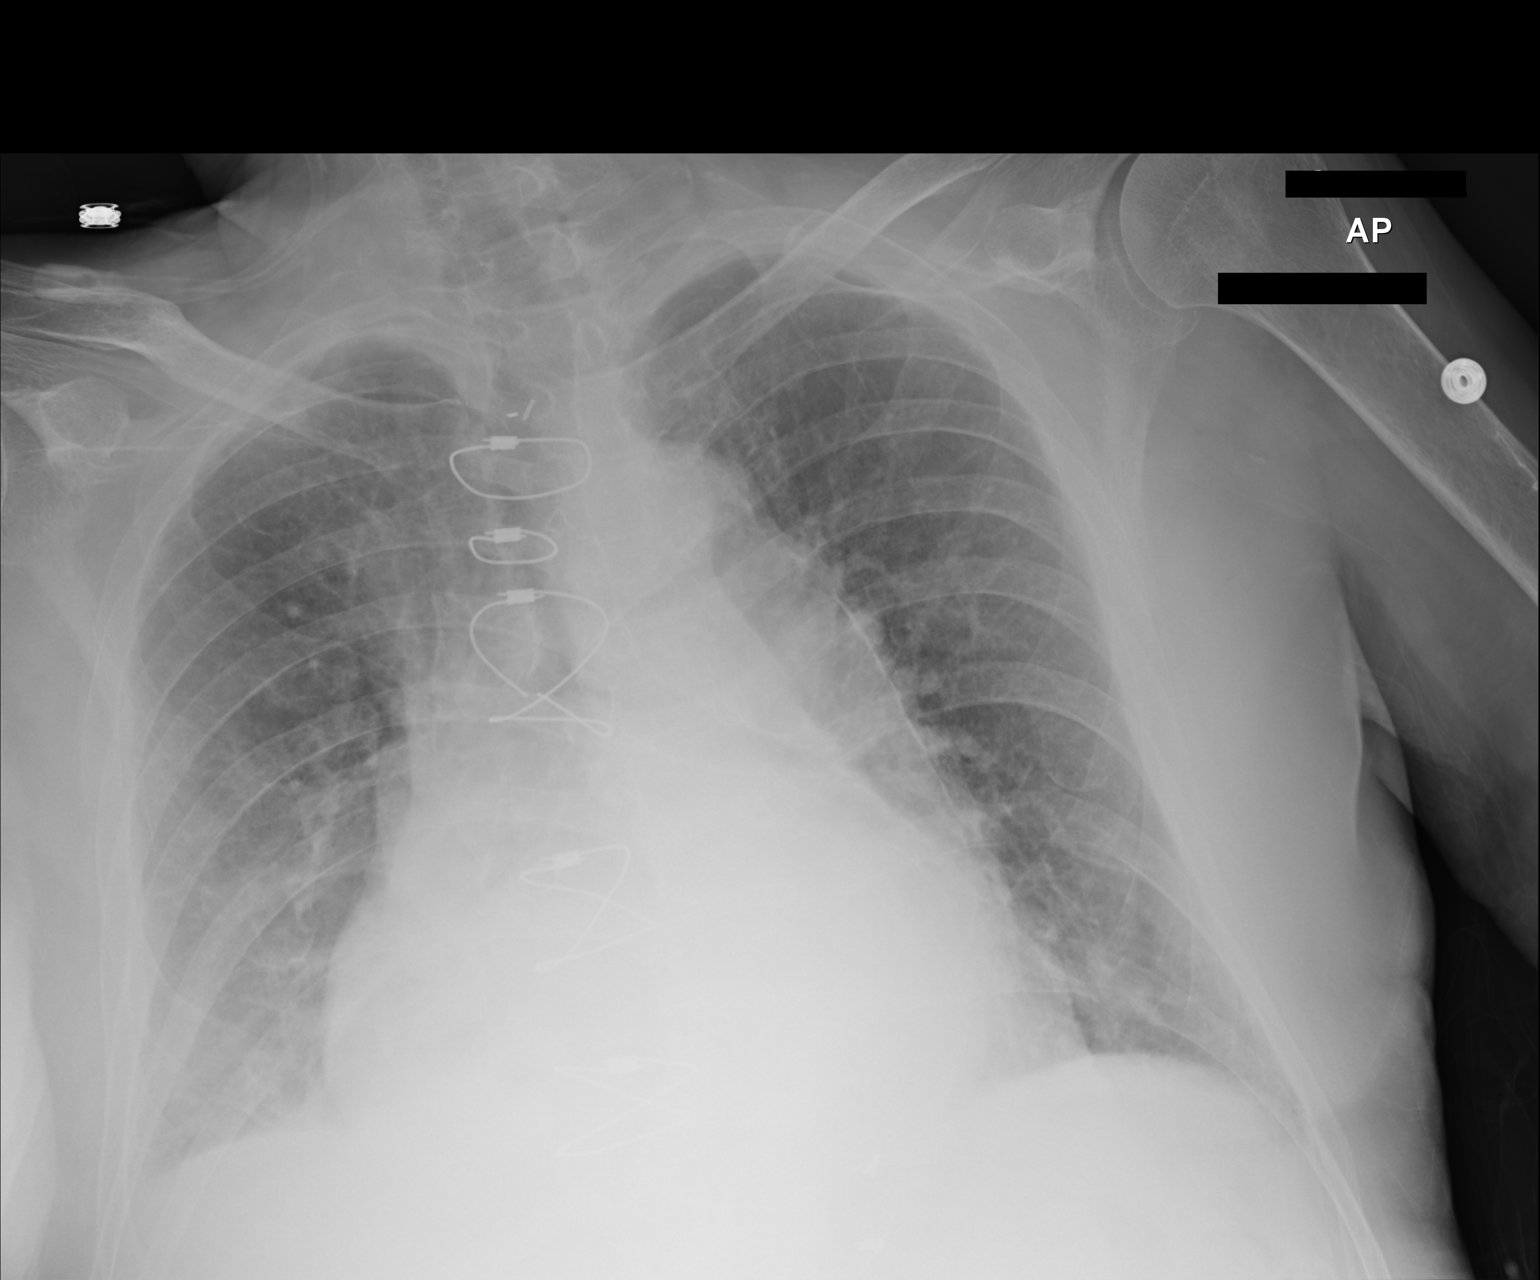

[1 of 1 positions shown; findings below may reference images not displayed]

FINDINGS: Cardiomegaly again noted. No pulmonary edema. Persistent hazy right
basilar atelectasis or infiltrate. Status post median sternotomy.
Atherosclerotic calcifications of thoracic aorta.
IMPRESSION: Cardiomegaly. Persistent hazy right basilar atelectasis or
infiltrate. No pulmonary edema.

## 2015-11-15 DIAGNOSIS — M1712 Unilateral primary osteoarthritis, left knee: Secondary | ICD-10-CM | POA: Diagnosis not present

## 2015-11-15 DIAGNOSIS — M25562 Pain in left knee: Secondary | ICD-10-CM | POA: Diagnosis not present

## 2015-11-24 DIAGNOSIS — H353122 Nonexudative age-related macular degeneration, left eye, intermediate dry stage: Secondary | ICD-10-CM | POA: Diagnosis not present

## 2015-11-24 DIAGNOSIS — H353112 Nonexudative age-related macular degeneration, right eye, intermediate dry stage: Secondary | ICD-10-CM | POA: Diagnosis not present

## 2015-11-24 DIAGNOSIS — H349 Unspecified retinal vascular occlusion: Secondary | ICD-10-CM | POA: Diagnosis not present

## 2015-11-24 DIAGNOSIS — H524 Presbyopia: Secondary | ICD-10-CM | POA: Diagnosis not present

## 2015-11-29 DIAGNOSIS — Z23 Encounter for immunization: Secondary | ICD-10-CM | POA: Diagnosis not present

## 2015-11-29 DIAGNOSIS — J90 Pleural effusion, not elsewhere classified: Secondary | ICD-10-CM | POA: Diagnosis not present

## 2015-11-29 DIAGNOSIS — R0602 Shortness of breath: Secondary | ICD-10-CM | POA: Diagnosis not present

## 2016-01-03 DIAGNOSIS — E7800 Pure hypercholesterolemia, unspecified: Secondary | ICD-10-CM | POA: Diagnosis not present

## 2016-01-03 DIAGNOSIS — I10 Essential (primary) hypertension: Secondary | ICD-10-CM | POA: Diagnosis not present

## 2016-01-05 DIAGNOSIS — M25562 Pain in left knee: Secondary | ICD-10-CM | POA: Diagnosis not present

## 2016-01-05 DIAGNOSIS — M1712 Unilateral primary osteoarthritis, left knee: Secondary | ICD-10-CM | POA: Diagnosis not present

## 2016-01-25 DIAGNOSIS — R0602 Shortness of breath: Secondary | ICD-10-CM | POA: Diagnosis not present

## 2016-01-25 DIAGNOSIS — R531 Weakness: Secondary | ICD-10-CM | POA: Diagnosis not present

## 2016-01-27 ENCOUNTER — Inpatient Hospital Stay (HOSPITAL_COMMUNITY)
Admission: EM | Admit: 2016-01-27 | Discharge: 2016-01-30 | DRG: 291 | Disposition: A | Discharge status: Home Health | Payer: Medicare Other | Attending: Internal Medicine | Admitting: Internal Medicine

## 2016-01-27 ENCOUNTER — Emergency Department (HOSPITAL_COMMUNITY): Payer: Medicare Other

## 2016-01-27 ENCOUNTER — Encounter (HOSPITAL_COMMUNITY): Payer: Self-pay | Admitting: Emergency Medicine

## 2016-01-27 DIAGNOSIS — I110 Hypertensive heart disease with heart failure: Principal | ICD-10-CM | POA: Diagnosis present

## 2016-01-27 DIAGNOSIS — Z96642 Presence of left artificial hip joint: Secondary | ICD-10-CM | POA: Diagnosis not present

## 2016-01-27 DIAGNOSIS — D696 Thrombocytopenia, unspecified: Secondary | ICD-10-CM | POA: Diagnosis not present

## 2016-01-27 DIAGNOSIS — Z66 Do not resuscitate: Secondary | ICD-10-CM | POA: Diagnosis present

## 2016-01-27 DIAGNOSIS — Z85830 Personal history of malignant neoplasm of bone: Secondary | ICD-10-CM | POA: Diagnosis not present

## 2016-01-27 DIAGNOSIS — R0902 Hypoxemia: Secondary | ICD-10-CM | POA: Diagnosis not present

## 2016-01-27 DIAGNOSIS — E7800 Pure hypercholesterolemia, unspecified: Secondary | ICD-10-CM | POA: Diagnosis not present

## 2016-01-27 DIAGNOSIS — I2510 Atherosclerotic heart disease of native coronary artery without angina pectoris: Secondary | ICD-10-CM | POA: Diagnosis present

## 2016-01-27 DIAGNOSIS — I5031 Acute diastolic (congestive) heart failure: Secondary | ICD-10-CM | POA: Diagnosis not present

## 2016-01-27 DIAGNOSIS — I5033 Acute on chronic diastolic (congestive) heart failure: Secondary | ICD-10-CM | POA: Diagnosis present

## 2016-01-27 DIAGNOSIS — Z951 Presence of aortocoronary bypass graft: Secondary | ICD-10-CM | POA: Diagnosis not present

## 2016-01-27 DIAGNOSIS — J9621 Acute and chronic respiratory failure with hypoxia: Secondary | ICD-10-CM | POA: Diagnosis not present

## 2016-01-27 DIAGNOSIS — R0602 Shortness of breath: Secondary | ICD-10-CM | POA: Diagnosis not present

## 2016-01-27 DIAGNOSIS — Z79899 Other long term (current) drug therapy: Secondary | ICD-10-CM

## 2016-01-27 DIAGNOSIS — Z8546 Personal history of malignant neoplasm of prostate: Secondary | ICD-10-CM

## 2016-01-27 DIAGNOSIS — I509 Heart failure, unspecified: Secondary | ICD-10-CM | POA: Diagnosis not present

## 2016-01-27 DIAGNOSIS — I080 Rheumatic disorders of both mitral and aortic valves: Secondary | ICD-10-CM | POA: Diagnosis not present

## 2016-01-27 DIAGNOSIS — R069 Unspecified abnormalities of breathing: Secondary | ICD-10-CM | POA: Diagnosis not present

## 2016-01-27 DIAGNOSIS — I482 Chronic atrial fibrillation: Secondary | ICD-10-CM | POA: Diagnosis present

## 2016-01-27 DIAGNOSIS — Z7982 Long term (current) use of aspirin: Secondary | ICD-10-CM | POA: Diagnosis not present

## 2016-01-27 DIAGNOSIS — I10 Essential (primary) hypertension: Secondary | ICD-10-CM

## 2016-01-27 DIAGNOSIS — I4891 Unspecified atrial fibrillation: Secondary | ICD-10-CM | POA: Diagnosis present

## 2016-01-27 DIAGNOSIS — H9190 Unspecified hearing loss, unspecified ear: Secondary | ICD-10-CM | POA: Diagnosis present

## 2016-01-27 DIAGNOSIS — I272 Pulmonary hypertension, unspecified: Secondary | ICD-10-CM | POA: Diagnosis present

## 2016-01-27 DIAGNOSIS — C61 Malignant neoplasm of prostate: Secondary | ICD-10-CM | POA: Diagnosis present

## 2016-01-27 LAB — CBC WITH DIFFERENTIAL/PLATELET
Basophils Absolute: 0 10*3/uL (ref 0.0–0.1)
Basophils Relative: 0 %
EOS ABS: 0 10*3/uL (ref 0.0–0.7)
Eosinophils Relative: 0 %
HCT: 43.7 % (ref 39.0–52.0)
HEMOGLOBIN: 14.1 g/dL (ref 13.0–17.0)
Lymphocytes Relative: 13 %
Lymphs Abs: 1 10*3/uL (ref 0.7–4.0)
MCH: 31.5 pg (ref 26.0–34.0)
MCHC: 32.3 g/dL (ref 30.0–36.0)
MCV: 97.5 fL (ref 78.0–100.0)
Monocytes Absolute: 0.6 10*3/uL (ref 0.1–1.0)
Monocytes Relative: 8 %
NEUTROS ABS: 6 10*3/uL (ref 1.7–7.7)
Neutrophils Relative %: 78 %
Platelets: 63 10*3/uL — ABNORMAL LOW (ref 150–400)
RBC: 4.48 MIL/uL (ref 4.22–5.81)
RDW: 19.4 % — ABNORMAL HIGH (ref 11.5–15.5)
WBC: 7.6 10*3/uL (ref 4.0–10.5)

## 2016-01-27 LAB — URINALYSIS, ROUTINE W REFLEX MICROSCOPIC
Bilirubin Urine: NEGATIVE
Glucose, UA: NEGATIVE mg/dL
KETONES UR: NEGATIVE mg/dL
LEUKOCYTES UA: NEGATIVE
Nitrite: NEGATIVE
PROTEIN: 30 mg/dL — AB
Specific Gravity, Urine: 1.012 (ref 1.005–1.030)
pH: 5 (ref 5.0–8.0)

## 2016-01-27 LAB — COMPREHENSIVE METABOLIC PANEL
ALK PHOS: 69 U/L (ref 38–126)
ALT: 13 U/L — AB (ref 17–63)
AST: 23 U/L (ref 15–41)
Albumin: 3.8 g/dL (ref 3.5–5.0)
Anion gap: 10 (ref 5–15)
BUN: 17 mg/dL (ref 6–20)
CALCIUM: 9.2 mg/dL (ref 8.9–10.3)
CHLORIDE: 107 mmol/L (ref 101–111)
CO2: 27 mmol/L (ref 22–32)
CREATININE: 0.79 mg/dL (ref 0.61–1.24)
Glucose, Bld: 121 mg/dL — ABNORMAL HIGH (ref 65–99)
Potassium: 3.5 mmol/L (ref 3.5–5.1)
Sodium: 144 mmol/L (ref 135–145)
Total Bilirubin: 1.5 mg/dL — ABNORMAL HIGH (ref 0.3–1.2)
Total Protein: 6.7 g/dL (ref 6.5–8.1)

## 2016-01-27 LAB — URINE MICROSCOPIC-ADD ON
Bacteria, UA: NONE SEEN
WBC, UA: NONE SEEN WBC/hpf (ref 0–5)

## 2016-01-27 LAB — TROPONIN I: TROPONIN I: 0.03 ng/mL — AB (ref <0.03)

## 2016-01-27 LAB — GLUCOSE, CAPILLARY: GLUCOSE-CAPILLARY: 124 mg/dL — AB (ref 65–99)

## 2016-01-27 LAB — BRAIN NATRIURETIC PEPTIDE: B NATRIURETIC PEPTIDE 5: 717.5 pg/mL — AB (ref 0.0–100.0)

## 2016-01-27 MED ORDER — ACETAMINOPHEN 325 MG PO TABS: 650.0000 mg | ORAL_TABLET | ORAL | Status: DC | PRN

## 2016-01-27 MED ORDER — ADULT MULTIVITAMIN W/MINERALS CH: 1.0000 | ORAL_TABLET | Freq: Every day | ORAL | Status: DC

## 2016-01-27 MED ORDER — METOPROLOL TARTRATE 25 MG PO TABS
25.0000 mg | ORAL_TABLET | Freq: Two times a day (BID) | ORAL | Status: DC
  Administered 2016-01-27 – 2016-01-30 (×6): 25 mg via ORAL
  Filled 2016-01-27 (×6): qty 1

## 2016-01-27 MED ORDER — ONDANSETRON HCL 4 MG/2ML IJ SOLN: 4.0000 mg | Freq: Four times a day (QID) | INTRAMUSCULAR | Status: DC | PRN

## 2016-01-27 MED ORDER — POTASSIUM CHLORIDE CRYS ER 20 MEQ PO TBCR
20.0000 meq | EXTENDED_RELEASE_TABLET | Freq: Two times a day (BID) | ORAL | Status: DC
  Administered 2016-01-27 – 2016-01-28 (×2): 20 meq via ORAL
  Filled 2016-01-27 (×2): qty 1

## 2016-01-27 MED ORDER — OCUVITE-LUTEIN PO CAPS
1.0000 | ORAL_CAPSULE | Freq: Two times a day (BID) | ORAL | Status: DC
  Administered 2016-01-27: 1 via ORAL
  Filled 2016-01-27 (×2): qty 1

## 2016-01-27 MED ORDER — POLYETHYLENE GLYCOL 3350 17 G PO PACK
17.0000 g | PACK | Freq: Every day | ORAL | Status: DC
  Administered 2016-01-28 – 2016-01-29 (×2): 17 g via ORAL
  Filled 2016-01-27 (×3): qty 1

## 2016-01-27 MED ORDER — SODIUM CHLORIDE 0.9 % IV SOLN: 250.0000 mL | INTRAVENOUS | Status: DC | PRN

## 2016-01-27 MED ORDER — ATORVASTATIN CALCIUM 10 MG PO TABS
10.0000 mg | ORAL_TABLET | Freq: Every day | ORAL | Status: DC
  Administered 2016-01-27 – 2016-01-30 (×4): 10 mg via ORAL
  Filled 2016-01-27 (×4): qty 1

## 2016-01-27 MED ORDER — SODIUM CHLORIDE 0.9% FLUSH: 3.0000 mL | INTRAVENOUS | Status: DC | PRN

## 2016-01-27 MED ORDER — SODIUM CHLORIDE 0.9% FLUSH
3.0000 mL | Freq: Two times a day (BID) | INTRAVENOUS | Status: DC
  Administered 2016-01-27 – 2016-01-29 (×5): 3 mL via INTRAVENOUS

## 2016-01-27 MED ORDER — OMEGA-3-ACID ETHYL ESTERS 1 G PO CAPS
1000.0000 mg | ORAL_CAPSULE | Freq: Every day | ORAL | Status: DC
Start: 2016-01-28 — End: 2016-01-30
  Administered 2016-01-28 – 2016-01-30 (×3): 1000 mg via ORAL
  Filled 2016-01-27 (×3): qty 1

## 2016-01-27 MED ORDER — MIRTAZAPINE 15 MG PO TABS
7.5000 mg | ORAL_TABLET | Freq: Every day | ORAL | Status: DC
  Administered 2016-01-27 – 2016-01-29 (×3): 7.5 mg via ORAL
  Filled 2016-01-27 (×4): qty 1

## 2016-01-27 MED ORDER — FUROSEMIDE 10 MG/ML IJ SOLN
20.0000 mg | Freq: Once | INTRAMUSCULAR | Status: AC
  Administered 2016-01-27: 20 mg via INTRAVENOUS
  Filled 2016-01-27: qty 2

## 2016-01-27 MED ORDER — ASPIRIN 81 MG PO CHEW
324.0000 mg | CHEWABLE_TABLET | Freq: Once | ORAL | Status: AC
  Administered 2016-01-27: 324 mg via ORAL
  Filled 2016-01-27: qty 4

## 2016-01-27 MED ORDER — DILTIAZEM HCL 60 MG PO TABS
60.0000 mg | ORAL_TABLET | Freq: Two times a day (BID) | ORAL | Status: DC
  Administered 2016-01-27 – 2016-01-30 (×6): 60 mg via ORAL
  Filled 2016-01-27 (×6): qty 1

## 2016-01-27 MED ORDER — TRAMADOL HCL 50 MG PO TABS
50.0000 mg | ORAL_TABLET | Freq: Two times a day (BID) | ORAL | Status: DC | PRN
  Administered 2016-01-28: 100 mg via ORAL
  Filled 2016-01-27: qty 2

## 2016-01-27 MED ORDER — ASPIRIN 81 MG PO CHEW
81.0000 mg | CHEWABLE_TABLET | Freq: Every day | ORAL | Status: DC
  Administered 2016-01-27 – 2016-01-30 (×4): 81 mg via ORAL
  Filled 2016-01-27 (×4): qty 1

## 2016-01-27 MED ORDER — FUROSEMIDE 10 MG/ML IJ SOLN
60.0000 mg | Freq: Two times a day (BID) | INTRAMUSCULAR | Status: DC
  Administered 2016-01-28 (×2): 60 mg via INTRAVENOUS
  Filled 2016-01-27 (×3): qty 6

## 2016-01-27 NOTE — ED Provider Notes (Signed)
Labs c/w CHF. Stable at this time but requires Diuresis. Admit to hospitalist   Sherwood Gambler, MD 01/27/16 [phone]

## 2016-01-27 NOTE — ED Notes (Signed)
Attempted to call report to [place], receiving nurse is not ready and will return call

## 2016-01-27 NOTE — ED Notes (Signed)
Critical troponin of 0.03 received from the lab, [place] EDP notified

## 2016-01-27 NOTE — ED Triage Notes (Signed)
Pt to ED via [facility] from home-- pt lives with daughter-- pt has been having difficulty sleeping for past 4 days, went to PCP - [place] yesterday, has another appt next week with primary care dr. Has 2-3 + pitting edema from toes to knees- states used to be on a fluid pill but lost too much weight so the dr took him off of it. Pt is alert/oriented x 4, daughter at bedside.

## 2016-01-27 NOTE — H&P (Signed)
HISTORY AND PHYSICAL       PATIENT DETAILS Name: [name] Chang Age: 82 y.o. Sex: male Date of Birth: 07/19/1915 Admit Date: 01/27/2016 PCP:Pcp Not In System   Patient coming from: Home   CHIEF COMPLAINT:  Shortness of breath, leg swelling for approximately 7 days.  HPI: [name] Chang is a 82 y.o. male with medical history significant of CAD status post CABG, chronic diastolic heart failure, atrial fibrillation not on anticoagulation, from a cytopenia who presented to the hospital for evaluation of worsening shortness of breath and leg edema. Patient approximately around Thanksgiving time developed some leg edema and mild shortness of breath, over the past 3-4 days the symptoms have remarkably worsened. Edema in his legs have now extended up to his knees. Shortness of breath is mostly exertional, but for the past 3-4 days he has had significant orthopnea that he has been sitting up and sleeping. Interestingly, approximately a month or so back, patient's PCP discontinued patient's diuretics because of excessive weight loss, he had weight approximately 157 pounds then. He has not checked his weight since then.  Patient denies any fever, nausea, vomiting or chest pain.  ED Course: Evaluated in the ED-stable vital signs, chest x-ray showed bilateral pleural effusion and bibasilar atelectasis. BNP significantly elevated. Given intravenous Lasix 20 mg 1, I was called and asked to admit this patient for further evaluation and treatment.  Note: Lives at: Home with daughter Mobility: Cane/Walker Chronic Indwelling Foley:no   REVIEW OF SYSTEMS:  Constitutional:   No  weight loss, night sweats,  Fevers, chills, fatigue.  HEENT:    No headaches, Dysphagia,Tooth/dental problems,Sore throat,  No sneezing, itching, ear ache, nasal congestion, post nasal drip  Cardio-vascular: No chest pain, palpitations  GI:  No heartburn, indigestion, abdominal pain, nausea, vomiting,  diarrhea, melena or hematochezia  Resp: No shortness of breath, cough, hemoptysis,plueritic chest pain.   Skin:  No rash or lesions.  GU:  No dysuria, change in color of urine, no urgency or frequency.  No flank pain.  Musculoskeletal: No joint pain or swelling.  No decreased range of motion.  No back pain.  Endocrine: No heat intolerance, no cold intolerance, no polyuria, no polydipsia  Psych: No change in mood or affect.   ALLERGIES:  No Known Allergies  PAST MEDICAL HISTORY: Past Medical History:  Diagnosis Date  . Bilateral swelling of feet   . Bone cancer ([place])   . Heart trouble   . High cholesterol   . Hypertension   . Osteoarthritis     PAST SURGICAL HISTORY: Past Surgical History:  Procedure Laterality Date  . CHOLECYSTECTOMY    . HIP ARTHROPLASTY Left 03/20/2014   Procedure: ARTHROPLASTY BIPOLAR HIP;  Surgeon: Mauri Pole, MD;  Location: [place];  Service: Orthopedics;  Laterality: Left;    MEDICATIONS AT HOME: Prior to Admission medications   Medication Sig Start Date End Date Taking? Authorizing Provider  aspirin 81 MG chewable tablet Chew 1 tablet (81 mg total) by mouth daily. 03/23/14  Yes Verlee Monte, MD  atorvastatin (LIPITOR) 10 MG tablet Take 10 mg by mouth daily. 02/03/14  Yes Historical Provider, MD  Cholecalciferol (D-3-5) 5000 units capsule Take 5,000 Units by mouth daily.   Yes Historical Provider, MD  diltiazem (CARDIZEM) 60 MG tablet Take 60 mg by mouth 2 (two) times daily.  02/25/14  Yes Historical Provider, MD  docusate sodium (COLACE) 100 MG capsule Take 100 mg by mouth daily as  needed for mild constipation.   Yes Historical Provider, MD  glucosamine-chondroitin 500-400 MG tablet Take 1 tablet by mouth 2 (two) times daily.   Yes Historical Provider, MD  Melatonin 5 MG TABS Take 5 mg by mouth at bedtime as needed (sleep).   Yes Historical Provider, MD  meloxicam (MOBIC) 15 MG tablet Take 15 mg by mouth daily as needed for pain.   Yes  Historical Provider, MD  metoprolol (LOPRESSOR) 50 MG tablet Take 25 mg by mouth 2 (two) times daily.   Yes Historical Provider, MD  mirtazapine (REMERON) 7.5 MG tablet Take 7.5 mg by mouth at bedtime. 01/03/16  Yes Historical Provider, MD  Multiple Vitamin (MULTIVITAMIN WITH MINERALS) TABS tablet Take 1 tablet by mouth daily.   Yes Historical Provider, MD  Multiple Vitamins-Minerals (PRESERVISION AREDS 2) CAPS Take 1 tablet by mouth 2 (two) times daily.   Yes Historical Provider, MD  Omega 3 1200 MG CAPS Take 1,200 mg by mouth daily.   Yes Historical Provider, MD  polyethylene glycol (MIRALAX / GLYCOLAX) packet Take 17 g by mouth daily as needed for moderate constipation.   Yes Historical Provider, MD  traMADol (ULTRAM) 50 MG tablet Take 50-100 mg by mouth every 12 (twelve) hours as needed for severe pain.    Yes Historical Provider, MD    FAMILY HISTORY: No family history of congestive heart failure  SOCIAL HISTORY:  reports that he has never smoked. He has never used smokeless tobacco. He reports that he drinks alcohol. His drug history is not on file.  PHYSICAL EXAM: Blood pressure (!) 168/102, pulse 81, temperature 97.7 F (36.5 C), temperature source Oral, resp. rate (!) 30, SpO2 95 %.  General appearance :Awake, alert, mildly tachypneic but appears comfortable. Frail and chronically sick appearing. Very hard of hearing.  Eyes:, pupils equally reactive to light and accomodation,no scleral icterus. HEENT: Atraumatic and Normocephalic Neck: supple, +++JVD up to his jaw Resp:Good air entry bilaterally, bibasilar rales CVS: S1 S2 irregular, soft systolic murmur-approximately 3/6 GI: Bowel sounds present, Non tender and not distended with no gaurding, rigidity or rebound.No organomegaly Extremities: B/L Lower Ext shows +++ pitting edema up to his knees. Chronic venous stasis changes bilaterally. Legs appear warm. n Neurology:  speech clear,Non focal Psychiatric:Alert and oriented x 3.  Normal mood. Musculoskeletal:.No digital cyanosis Skin:No Rash, warm and dry  LABS ON ADMISSION:  I have personally reviewed following labs and imaging studies  CBC:  Recent Labs Lab 01/27/16 1503  WBC 7.6  NEUTROABS 6.0  HGB 14.1  HCT 43.7  MCV 97.5  PLT 63*    Basic Metabolic Panel:  Recent Labs Lab 01/27/16 1503  NA 144  K 3.5  CL 107  CO2 27  GLUCOSE 121*  BUN 17  CREATININE 0.79  CALCIUM 9.2    GFR: CrCl cannot be calculated (Unknown ideal weight.).  Liver Function Tests:  Recent Labs Lab 01/27/16 1503  AST 23  ALT 13*  ALKPHOS 69  BILITOT 1.5*  PROT 6.7  ALBUMIN 3.8   No results for input(s): LIPASE, AMYLASE in the last 168 hours. No results for input(s): AMMONIA in the last 168 hours.  Coagulation Profile: No results for input(s): INR, PROTIME in the last 168 hours.  Cardiac Enzymes:  Recent Labs Lab 01/27/16 1503  TROPONINI 0.03*    BNP (last 3 results) No results for input(s): PROBNP in the last 8760 hours.  HbA1C: No results for input(s): HGBA1C in the last 72 hours.  CBG: No results  for input(s): GLUCAP in the last 168 hours.  Lipid Profile: No results for input(s): CHOL, HDL, LDLCALC, TRIG, CHOLHDL, LDLDIRECT in the last 72 hours.  Thyroid Function Tests: No results for input(s): TSH, T4TOTAL, FREET4, T3FREE, THYROIDAB in the last 72 hours.  Anemia Panel: No results for input(s): VITAMINB12, FOLATE, FERRITIN, TIBC, IRON, RETICCTPCT in the last 72 hours.  Urine analysis:    Component Value Date/Time   COLORURINE ORANGE BIOCHEMICALS MAY BE AFFECTED BY COLOR (A) 07/29/2006 1126   APPEARANCEUR CLEAR 07/29/2006 1126   LABSPEC 1.029 07/29/2006 1126   PHURINE 6.0 07/29/2006 1126   GLUCOSEU NEGATIVE 07/29/2006 1126   HGBUR NEGATIVE 07/29/2006 1126   BILIRUBINUR SMALL (A) 07/29/2006 1126   KETONESUR 15 (A) 07/29/2006 1126   PROTEINUR 30 (A) 07/29/2006 1126   UROBILINOGEN 2.0 (H) 07/29/2006 1126   NITRITE NEGATIVE  07/29/2006 1126   LEUKOCYTESUR NEGATIVE 07/29/2006 1126    Sepsis Labs: Lactic Acid, Venous No results found for: [place]   Microbiology: No results found for this or any previous visit (from the past 240 hour(s)).    RADIOLOGIC STUDIES ON ADMISSION: Dg Chest 2 View  Result Date: 01/27/2016 CLINICAL DATA:  Shortness of Breath EXAM: CHEST  2 VIEW COMPARISON:  11/29/2015 FINDINGS: Cardiomegaly is noted. There is small to moderate bilateral pleural effusion with bilateral basilar atelectasis or infiltrate. Status post CABG. Osteopenia and mild degenerative change thoracic spine. No pulmonary edema. IMPRESSION: Small to moderate bilateral pleural effusion with bilateral basilar atelectasis or infiltrate. Status post CABG. No pulmonary edema. Electronically Signed   By: Lahoma Crocker M.D.   On: 01/27/2016 14:56    I have personally reviewed images of chest xray  EKG:  Personally reviewed. Atrial fibrillation  ASSESSMENT AND PLAN: Present on Admission: . Acute diastolic heart failure: Significantly volume overloaded-apparently 1 month back he weighed approximately 157 pounds-unsure of what his weight is. He will be admitted to the telemetry unit, started on intravenous Lasix, daily weights, strict intake and output will be followed. We will get an updated echocardiogram. I suspect him being off diuretics for approximately 1 month and excessive fluid/salt intake over the Thanksgiving holiday may be the culprit causing exacerbation. Given his advanced age and frailty, he is probably not a candidate for aggressive measures or for any further hemodynamic intervention.  . Essential hypertension: Continue metoprolol and Cardizem. Follow BP trend  . Chronic Atrial fibrillation: Rate controlled with Cardizem and metoprolol. Although he has a chads2vasc of at least 3, he is not a candidate for anticoagulation given advanced age, frailty, fall risk. Cautiously continue with aspirin-has baseline  thrombocytopenia.  Marland Kitchen History of thrombocytopenia: Chronic issue-he may have a underlying bone marrow issue given advanced age. Cautiously continue with aspirin-doubt further intervention including a bone marrow biopsy will change outcome or management. Follow platelet count periodically  . Coronary atherosclerosis: Cautiously continue with aspirin and beta blocker. Given his advanced age and frailty-we will not cycle troponins-as he will not be a candidate for any further intervention.  Marland Kitchen Hx of PROSTATE CANCER  . Chronic debility/deconditioning: PT/OT eval-we will likely require home health services on discharge. Patient lives with his daughter.  Further plan will depend as patient's clinical course evolves and further radiologic and laboratory data become available. Patient will be monitored closely.  Above noted plan was discussed with patient/daughter face to face at bedside, they were in agreement.   CONSULTS: None  DVT Prophylaxis: TED hose  Code Status: DNR-confirmed with patient and daughter at bedside  Disposition Plan:  Discharge back home with home health services vs SNF possibly in 3-4 days, depending on clinical course  Admission status: Inpatient  going to tele/ medical floor / SDU  Total time spent  55 minutes.Greater than 50% of this time was spent in counseling, explanation of diagnosis, planning of further management, and coordination of care.  [facility] Pager [phone]  If 7PM-7AM, please contact night-coverage [email or web address] Password [email or web address] 01/27/2016, 5:27 PM

## 2016-01-27 NOTE — ED Notes (Signed)
[facility] Hospitalist at bedside

## 2016-01-27 NOTE — ED Notes (Signed)
Pt resting comfortably, daughter at bedside.

## 2016-01-27 NOTE — ED Provider Notes (Signed)
[facility] DEPT Provider Note   CSN: [id number] Arrival date & time: 01/27/16  1400     History   Chief Complaint Chief Complaint  Patient presents with  . Shortness of Breath    HPI [name] Chang is a 82 y.o. male.  Pt presents to the ED with sob.  He said that for the past couple of days, he has not been able to sleep due to the sob.  He is not having any pain.  His lasix was recently d/c'd.  Per EMS, his oxygen was low, so he was put on nasal cannula.      Past Medical History:  Diagnosis Date  . Bilateral swelling of feet   . Bone cancer ([place])   . Heart trouble   . High cholesterol   . Hypertension   . Osteoarthritis     Patient Active Problem List   Diagnosis Date Noted  . Left displaced femoral neck fracture ([place]) 03/17/2014  . Distal radius fracture, left 03/17/2014  . Hyperlipidemia 03/17/2014  . Closed left hip fracture ([place]) 03/17/2014  . PROSTATE CANCER 07/01/2009  . HYPERCHOLESTEROLEMIA 07/01/2009  . Essential hypertension 07/01/2009  . MYOCARDIAL INFARCTION 07/01/2009  . Coronary atherosclerosis 07/01/2009  . Atrial fibrillation ([place]) 07/01/2009  . GERD 07/01/2009  . HIATAL HERNIA 07/01/2009  . CHOLECYSTITIS 07/01/2009    Past Surgical History:  Procedure Laterality Date  . CHOLECYSTECTOMY    . HIP ARTHROPLASTY Left 03/20/2014   Procedure: ARTHROPLASTY BIPOLAR HIP;  Surgeon: Mauri Pole, MD;  Location: [facility];  Service: Orthopedics;  Laterality: Left;       Home Medications    Prior to Admission medications   Medication Sig Start Date End Date Taking? Authorizing Provider  aspirin 81 MG chewable tablet Chew 1 tablet (81 mg total) by mouth daily. 03/23/14   Verlee Monte, MD  atorvastatin (LIPITOR) 10 MG tablet Take 10 mg by mouth daily. 02/03/14   Historical Provider, MD  CHONDROITIN SULFATE PO Take 1 tablet by mouth 2 (two) times daily.     Historical Provider, MD  diltiazem (CARDIZEM) 60 MG tablet Take 60 mg by mouth 2 (two) times  daily.  02/25/14   Historical Provider, MD  enoxaparin (LOVENOX) 40 MG/0.4ML injection Inject 0.4 mLs (40 mg total) into the skin daily. 03/23/14   Verlee Monte, MD  Glucosamine 750 MG TABS Take 750 mg by mouth 3 (three) times daily.    Historical Provider, MD  metoprolol (LOPRESSOR) 50 MG tablet Take 25 mg by mouth 2 (two) times daily.    Historical Provider, MD  Multiple Vitamin (MULTIVITAMIN WITH MINERALS) TABS tablet Take 1 tablet by mouth daily.    Historical Provider, MD  Multiple Vitamins-Minerals (PRESERVISION AREDS 2) CAPS Take 1 tablet by mouth 2 (two) times daily.    Historical Provider, MD  Omega 3 1200 MG CAPS Take 1,200 mg by mouth daily.    Historical Provider, MD  omeprazole (PRILOSEC OTC) 20 MG tablet Take 20 mg by mouth daily.    Historical Provider, MD  psyllium (METAMUCIL) 58.6 % packet Take 1 packet by mouth daily.    Historical Provider, MD  traMADol-acetaminophen (ULTRACET) 37.5-325 MG per tablet Take 1 tablet by mouth every 6 (six) hours as needed. 03/23/14   Verlee Monte, MD    Family History History reviewed. No pertinent family history.  Social History Social History  Substance Use Topics  . Smoking status: Never Smoker  . Smokeless tobacco: Never Used  . Alcohol use Yes  Comment: occassional     Allergies   Patient has no known allergies.   Review of Systems Review of Systems  Respiratory: Positive for shortness of breath.   All other systems reviewed and are negative.    Physical Exam Updated Vital Signs BP (!) 156/102   Pulse 76   Temp 97.7 F (36.5 C) (Oral)   Resp 20   SpO2 97%   Physical Exam  Constitutional: He is oriented to person, place, and time. He appears well-developed and well-nourished.  HENT:  Head: Normocephalic and atraumatic.  Right Ear: External ear normal.  Left Ear: External ear normal.  Nose: Nose normal.  Mouth/Throat: Oropharynx is clear and moist.  Eyes: Conjunctivae and EOM are normal. Pupils are equal,  round, and reactive to light.  Neck: Normal range of motion. Neck supple.  Cardiovascular: Normal rate, normal heart sounds and intact distal pulses.  An irregularly irregular rhythm present.  Pulmonary/Chest: Effort normal. He has rales.  Abdominal: Soft. Bowel sounds are normal.  Musculoskeletal: Normal range of motion. He exhibits edema.  Neurological: He is alert and oriented to person, place, and time.  Skin: Skin is warm.  Psychiatric: He has a normal mood and affect. His behavior is normal. Judgment and thought content normal.  Nursing note and vitals reviewed.    ED Treatments / Results  Labs (all labs ordered are listed, but only abnormal results are displayed) Labs Reviewed  COMPREHENSIVE METABOLIC PANEL  CBC WITH DIFFERENTIAL/PLATELET  TROPONIN I  BRAIN NATRIURETIC PEPTIDE  URINALYSIS, ROUTINE W REFLEX MICROSCOPIC (NOT AT [facility])    EKG  EKG Interpretation  Date/Time:  Thursday January 27 2016 15:31:07 EST Ventricular Rate:  80 PR Interval:    QRS Duration: 100 QT Interval:  426 QTC Calculation: 491 R Axis:   56 Text Interpretation:  Atrial fibrillation Nonspecific T wave abnormality , probably digitalis effect Prolonged QT Abnormal ECG Confirmed by Gilford Raid MD, Dyami Umbach ([id number]) on 01/27/2016 3:38:40 PM       Radiology Dg Chest 2 View  Result Date: 01/27/2016 CLINICAL DATA:  Shortness of Breath EXAM: CHEST  2 VIEW COMPARISON:  11/29/2015 FINDINGS: Cardiomegaly is noted. There is small to moderate bilateral pleural effusion with bilateral basilar atelectasis or infiltrate. Status post CABG. Osteopenia and mild degenerative change thoracic spine. No pulmonary edema. IMPRESSION: Small to moderate bilateral pleural effusion with bilateral basilar atelectasis or infiltrate. Status post CABG. No pulmonary edema. Electronically Signed   By: Lahoma Crocker M.D.   On: 01/27/2016 14:56    Procedures Procedures (including critical care time)  Medications Ordered in  ED Medications  furosemide (LASIX) injection 20 mg (not administered)     Initial Impression / Assessment and Plan / ED Course  I have reviewed the triage vital signs and the nursing notes.  Pertinent labs & imaging results that were available during my care of the patient were reviewed by me and considered in my medical decision making (see chart for details).  Clinical Course    Pt has improved with oxygen therapy.  He does not wear oxygen at home.  Pt given a dose of IV lasix here.   Labs are pending at shift change.  Pt signed out to Dr. Regenia Skeeter.  I anticipate admission for CHF. Pt updated on plan.  Final Clinical Impressions(s) / ED Diagnoses   Final diagnoses:  Acute on chronic congestive heart failure, unspecified congestive heart failure type ([place])  Hypoxia    New Prescriptions New Prescriptions   No medications on  file     Isla Pence, MD 01/29/16 [phone]

## 2016-01-27 NOTE — ED Notes (Signed)
Pt taken to xray 

## 2016-01-28 LAB — CBC WITH DIFFERENTIAL/PLATELET
Basophils Absolute: 0 10*3/uL (ref 0.0–0.1)
Basophils Relative: 0 %
EOS PCT: 0 %
Eosinophils Absolute: 0 10*3/uL (ref 0.0–0.7)
HCT: 41.7 % (ref 39.0–52.0)
Hemoglobin: 13.3 g/dL (ref 13.0–17.0)
LYMPHS ABS: 1 10*3/uL (ref 0.7–4.0)
LYMPHS PCT: 10 %
MCH: 31.3 pg (ref 26.0–34.0)
MCHC: 31.9 g/dL (ref 30.0–36.0)
MCV: 98.1 fL (ref 78.0–100.0)
MONOS PCT: 8 %
Monocytes Absolute: 0.8 10*3/uL (ref 0.1–1.0)
Neutro Abs: 7.7 10*3/uL (ref 1.7–7.7)
Neutrophils Relative %: 81 %
PLATELETS: 67 10*3/uL — AB (ref 150–400)
RBC: 4.25 MIL/uL (ref 4.22–5.81)
RDW: 19.6 % — ABNORMAL HIGH (ref 11.5–15.5)
WBC: 9.5 10*3/uL (ref 4.0–10.5)

## 2016-01-28 LAB — BASIC METABOLIC PANEL
Anion gap: 12 (ref 5–15)
BUN: 15 mg/dL (ref 6–20)
CHLORIDE: 104 mmol/L (ref 101–111)
CO2: 26 mmol/L (ref 22–32)
Calcium: 8.8 mg/dL — ABNORMAL LOW (ref 8.9–10.3)
Creatinine, Ser: 0.79 mg/dL (ref 0.61–1.24)
GFR calc Af Amer: >60 mL/min (ref >60)
GFR calc non Af Amer: >60 mL/min (ref >60)
GLUCOSE: 138 mg/dL — AB (ref 65–99)
POTASSIUM: 3.4 mmol/L — AB (ref 3.5–5.1)
SODIUM: 142 mmol/L (ref 135–145)

## 2016-01-28 MED ORDER — POTASSIUM CHLORIDE CRYS ER 20 MEQ PO TBCR
40.0000 meq | EXTENDED_RELEASE_TABLET | Freq: Two times a day (BID) | ORAL | Status: DC
  Administered 2016-01-28 – 2016-01-29 (×3): 40 meq via ORAL
  Filled 2016-01-28 (×3): qty 2

## 2016-01-28 MED ORDER — PROSIGHT PO TABS
1.0000 | ORAL_TABLET | Freq: Two times a day (BID) | ORAL | Status: DC
  Administered 2016-01-28 – 2016-01-30 (×5): 1 via ORAL
  Filled 2016-01-28 (×5): qty 1

## 2016-01-28 NOTE — Progress Notes (Signed)
Pt confused and trying to get out of chair. Pt was in 3e30 but at end of hallway. For pt safety, pt moved to 3e17 to be watched on camera and close to nurses station. Family aware of room change and aware of camera monitor on for pt safety.

## 2016-01-28 NOTE — Evaluation (Signed)
Physical Therapy Evaluation Patient Details Name: [name] Chang MRN: [id number] DOB: 1915-05-21 Today's Date: 01/28/2016   History of Present Illness  Pt is a 82 y/o male admitted secondary to SOB and LE edema. PMH including but not limited to CHF, CAD s/p CABG, A-fib and L hip arthoroplasty (2016). Per pt's daughter, he also has dementia but this is not documented in the EMR H&P.  Clinical Impression  Pt presented supine in bed with HOB elevated, awake and willing to participate in therapy session. Prior to admission, pt used a RW to ambulate short distances within his home and required assistance with bathing. Pt's daughter was present throughout evaluation and currently resides with pt. Pt was very confused and somewhat agitated throughout, stating "Can't I do this tomorrow. Today I just want to rest!" Pt's daughter expressed concern with him returning home; however, does not feel that he can go to a facility secondary to financial issues. PT relayed information to SW who stated that someone would follow up with pt and pt's daughter tomorrow. Pt would continue to benefit from skilled physical therapy services at this time while admitted and after d/c to address his below listed limitations in order to improve his overall safety and independence with functional mobility.     Follow Up Recommendations SNF;Supervision/Assistance - 24 hour(if pt refuses, will need max HH services including HH PT, HH OT, HH aide and HHSW)    Equipment Recommendations  None recommended by PT    Recommendations for Other Services       Precautions / Restrictions Precautions Precautions: Fall Restrictions Weight Bearing Restrictions: No      Mobility  Bed Mobility Overal bed mobility: Needs Assistance Bed Mobility: Supine to Sit;Sit to Supine     Supine to sit: Min guard;HOB elevated Sit to supine: Min guard   General bed mobility comments: pt required increased time, use of bedrails and min guard for  safety  Transfers Overall transfer level: Needs assistance Equipment used: Rolling walker (2 wheeled) Transfers: Sit to/from Stand Sit to Stand: Min guard         General transfer comment: pt required increased time, min guard for safety. pt became frustrated and agitated when attempting to stand as he was adamant about standing without assistance  Ambulation/Gait Ambulation/Gait assistance: Min guard Ambulation Distance (Feet): 3 Feet Assistive device: Rolling walker (2 wheeled) Gait Pattern/deviations: Step-through pattern;Decreased stride length Gait velocity: decreased   General Gait Details: pt became very frustrated and agitated when therapist was "too close" to him, guarding him for safety and refused to ambulate any further  Stairs            Wheelchair Mobility    Modified Rankin (Stroke Patients Only)       Balance Overall balance assessment: Needs assistance Sitting-balance support: Feet supported;No upper extremity supported Sitting balance-Leahy Scale: Fair     Standing balance support: During functional activity;Bilateral upper extremity supported Standing balance-Leahy Scale: Poor Standing balance comment: pt reliant on bilateral UEs on RW                             Pertinent Vitals/Pain Pain Assessment: No/denies pain    Home Living Family/patient expects to be discharged to:: Private residence Living Arrangements: Children Available Help at Discharge: Family;Available 24 hours/day Type of Home: House Home Access: Stairs to enter Entrance Stairs-Rails: Psychiatric nurse of Steps: 3 Home Layout: One level Home Equipment: Walker - 2 wheels;Walker -  4 wheels;Cane - single point      Prior Function Level of Independence: Needs assistance   Gait / Transfers Assistance Needed: pt's daughter reported that he ambulates with RW  ADL's / Homemaking Assistance Needed: pt's daughter reports that pt receives assistance  with bathing 1x/week from an aide from the county        Hand Dominance        Extremity/Trunk Assessment   Upper Extremity Assessment: Generalized weakness           Lower Extremity Assessment: Generalized weakness         Communication   Communication: HOH  Cognition Arousal/Alertness: Awake/alert Behavior During Therapy: Agitated;WFL for tasks assessed/performed;Restless Overall Cognitive Status: Impaired/Different from baseline Area of Impairment: Attention;Memory;Following commands;Safety/judgement;Awareness;Problem solving   Current Attention Level: Selective Memory: Decreased short-term memory Following Commands: Follows one step commands inconsistently;Follows one step commands with increased time Safety/Judgement: Decreased awareness of safety;Decreased awareness of deficits   Problem Solving: Slow processing;Requires verbal cues;Requires tactile cues      General Comments      Exercises     Assessment/Plan    PT Assessment Patient needs continued PT services  PT Problem List Decreased strength;Decreased range of motion;Decreased activity tolerance;Decreased balance;Decreased mobility;Decreased cognition;Decreased coordination;Decreased knowledge of use of DME;Decreased safety awareness;Cardiopulmonary status limiting activity          PT Treatment Interventions DME instruction;Gait training;Stair training;Functional mobility training;Therapeutic activities;Therapeutic exercise;Balance training;Neuromuscular re-education;Patient/family education    PT Goals (Current goals can be found in the Care Plan section)  Acute Rehab PT Goals Patient Stated Goal: "I just want to rest!" PT Goal Formulation: With patient/family Time For Goal Achievement: 02/11/16 Potential to Achieve Goals: Fair    Frequency Min 3X/week   Barriers to discharge        Co-evaluation               End of Session Equipment Utilized During Treatment: Gait  belt;Oxygen Activity Tolerance: Treatment limited secondary to agitation;Patient limited by fatigue Patient left: in bed;with call bell/phone within reach;with bed alarm set;with family/visitor present Nurse Communication: Mobility status         Time: [id number] PT Time Calculation (min) (ACUTE ONLY): 27 min   Charges:   PT Evaluation $PT Eval Moderate Complexity: 1 Procedure PT Treatments $Therapeutic Activity: 8-22 mins   PT G CodesClearnce Sorrel Eshani Maestre 01/28/2016, 2:05 PM Sherie Don, [place], DPT [phone]

## 2016-01-28 NOTE — Progress Notes (Addendum)
PROGRESS NOTE        PATIENT DETAILS Name: [name] Chang Age: 82 y.o. Sex: male Date of Birth: [birth year] Admit Date: 01/27/2016 Admitting Physician Evalee Mutton Kristeen Mans, MD PCP:Pcp Not In System  Brief Narrative: Patient is a 82 y.o. male with medical history significant of CAD status post CABG, chronic diastolic heart failure, atrial fibrillation not on anticoagulation, thrombocytopenia who presented to the hospital for evaluation of worsening shortness of breath and leg edema-he was found to be significantly volume overloaded, and admitted to the hospital for acute on chronic diastolic heart failure.  Subjective: Breathing better-although lying comfortably in bed-still not at baseline. Still with significant edema.  Assessment/Plan: Acute on chronic diastolic heart failure: Somewhat improved but still not at baseline. He continues to be significantly volume overloaded, and continues to be mildly tachypneic.His "dry weight" is around 157 pounds-his weight is still significantly elevated  at 164 pounds this am. Continue Lasix, follow weights and strict intake output.  Essential hypertension: Continue metoprolol and Cardizem. Follow BP trend  Chronic Atrial fibrillation: Rate controlled with Cardizem and metoprolol. Although he has a chads2vasc of at least 3, he is not a candidate for anticoagulation given advanced age, frailty, fall risk. Cautiously continue with aspirin-has baseline thrombocytopenia.  History of thrombocytopenia: Chronic issue-he may have a underlying bone marrow issue given advanced age. Cautiously continue with aspirin-doubt further intervention including a bone marrow biopsy will change outcome or management. Follow platelet count periodically  Coronary atherosclerosis: Cautiously continue with aspirin and beta blocker. Given his advanced age and frailty-we will not cycle troponins-as he will not be a candidate for any further  intervention.  Hx of PROSTATE CANCER   Chronic debility/deconditioning: PT/OT eval-we will likely require home health services on discharge. Patient lives with his daughter.  DVT Prophylaxis: Maryln Manuel  Code Status:  DNR  Family Communication: None at bedside  Disposition Plan: Remain inpatient-but will plan on Home health vs SNF on discharge  Antimicrobial agents: None  Procedures: None  CONSULTS:  None  Time spent: 25 minutes-Greater than 50% of this time was spent in counseling, explanation of diagnosis, planning of further management, and coordination of care.  MEDICATIONS: Anti-infectives    None      Scheduled Meds: . aspirin  81 mg Oral Daily  . atorvastatin  10 mg Oral Daily  . diltiazem  60 mg Oral BID  . furosemide  60 mg Intravenous BID  . metoprolol  25 mg Oral BID  . mirtazapine  7.5 mg Oral QHS  . multivitamin  1 tablet Oral BID  . omega-3 acid ethyl esters  1,000 mg Oral Daily  . polyethylene glycol  17 g Oral Daily  . potassium chloride  20 mEq Oral BID  . sodium chloride flush  3 mL Intravenous Q12H   Continuous Infusions: PRN Meds:.sodium chloride, acetaminophen, ondansetron (ZOFRAN) IV, sodium chloride flush, traMADol   PHYSICAL EXAM: Vital signs: Vitals:   01/27/16 2016 01/27/16 2056 01/28/16 0100 01/28/16 0700  BP: 157/94 (!) 141/92 133/89 130/80  Pulse: 91  89 80  Resp: '22 20 20 19  '$ Temp:  97.5 F (36.4 C) 97.6 F (36.4 C) 97.2 F (36.2 C)  TempSrc:  Oral Oral Oral  SpO2: 95% 93% 94% 95%  Weight:  74.9 kg (165 lb 1.6 oz)  74.4 kg (164 lb 1.6 oz)  Height:  $'5\' 8"'y$  (1.727 m)     Filed Weights   01/27/16 2056 01/28/16 0700  Weight: 74.9 kg (165 lb 1.6 oz) 74.4 kg (164 lb 1.6 oz)   Body mass index is 24.95 kg/m.   General appearance :Awake, alert, not in any distress. Speech Clear. Not toxic Looking Eyes:, pupils equally reactive to light and accomodation HEENT: Atraumatic and Normocephalic Neck: supple, + JVD.   Resp:Good air entry bilaterally, Still has a few bibasilar rales CVS: S1 S2 irregular GI: Bowel sounds present, Non tender and not distended with no gaurding, rigidity or rebound.No organomegaly Extremities: B/L Lower Ext shows ++ edema, both legs are warm to touch Neurology:  speech clear,Non focal Musculoskeletal:No digital cyanosis Skin:No Rash, warm and dry Wounds:N/A  I have personally reviewed following labs and imaging studies  LABORATORY DATA: CBC:  Recent Labs Lab 01/27/16 1503 01/28/16 0314  WBC 7.6 9.5  NEUTROABS 6.0 7.7  HGB 14.1 13.3  HCT 43.7 41.7  MCV 97.5 98.1  PLT 63* 67*    Basic Metabolic Panel:  Recent Labs Lab 01/27/16 1503 01/28/16 0314  NA 144 142  K 3.5 3.4*  CL 107 104  CO2 27 26  GLUCOSE 121* 138*  BUN 17 15  CREATININE 0.79 0.79  CALCIUM 9.2 8.8*    GFR: Estimated Creatinine Clearance: 49.9 mL/min (by C-G formula based on SCr of 0.79 mg/dL).  Liver Function Tests:  Recent Labs Lab 01/27/16 1503  AST 23  ALT 13*  ALKPHOS 69  BILITOT 1.5*  PROT 6.7  ALBUMIN 3.8   No results for input(s): LIPASE, AMYLASE in the last 168 hours. No results for input(s): AMMONIA in the last 168 hours.  Coagulation Profile: No results for input(s): INR, PROTIME in the last 168 hours.  Cardiac Enzymes:  Recent Labs Lab 01/27/16 1503  TROPONINI 0.03*    BNP (last 3 results) No results for input(s): PROBNP in the last 8760 hours.  HbA1C: No results for input(s): HGBA1C in the last 72 hours.  CBG:  Recent Labs Lab 01/27/16 2251  GLUCAP 124*    Lipid Profile: No results for input(s): CHOL, HDL, LDLCALC, TRIG, CHOLHDL, LDLDIRECT in the last 72 hours.  Thyroid Function Tests: No results for input(s): TSH, T4TOTAL, FREET4, T3FREE, THYROIDAB in the last 72 hours.  Anemia Panel: No results for input(s): VITAMINB12, FOLATE, FERRITIN, TIBC, IRON, RETICCTPCT in the last 72 hours.  Urine analysis:    Component Value Date/Time    COLORURINE YELLOW 01/27/2016 [place] 01/27/2016 1642   LABSPEC 1.012 01/27/2016 1642   PHURINE 5.0 01/27/2016 1642   GLUCOSEU NEGATIVE 01/27/2016 1642   HGBUR SMALL (A) 01/27/2016 1642   BILIRUBINUR NEGATIVE 01/27/2016 1642   KETONESUR NEGATIVE 01/27/2016 1642   PROTEINUR 30 (A) 01/27/2016 1642   UROBILINOGEN 2.0 (H) 07/29/2006 1126   NITRITE NEGATIVE 01/27/2016 1642   LEUKOCYTESUR NEGATIVE 01/27/2016 1642    Sepsis Labs: Lactic Acid, Venous No results found for: LATICACIDVEN  MICROBIOLOGY: No results found for this or any previous visit (from the past 240 hour(s)).  RADIOLOGY STUDIES/RESULTS: Dg Chest 2 View  Result Date: 01/27/2016 CLINICAL DATA:  Shortness of Breath EXAM: CHEST  2 VIEW COMPARISON:  11/29/2015 FINDINGS: Cardiomegaly is noted. There is small to moderate bilateral pleural effusion with bilateral basilar atelectasis or infiltrate. Status post CABG. Osteopenia and mild degenerative change thoracic spine. No pulmonary edema. IMPRESSION: Small to moderate bilateral pleural effusion with bilateral basilar atelectasis or infiltrate. Status post CABG. No pulmonary edema.  Electronically Signed   By: Lahoma Crocker M.D.   On: 01/27/2016 14:56     LOS: 1 day   Oren Binet, MD  [facility] Pager:[phone]  If 7PM-7AM, please contact night-coverage [email or web address] Password [facility] 01/28/2016, 10:37 AM

## 2016-01-29 ENCOUNTER — Inpatient Hospital Stay (HOSPITAL_COMMUNITY): Payer: Medicare Other

## 2016-01-29 DIAGNOSIS — I509 Heart failure, unspecified: Secondary | ICD-10-CM

## 2016-01-29 LAB — BASIC METABOLIC PANEL
Anion gap: 9 (ref 5–15)
BUN: 15 mg/dL (ref 6–20)
CHLORIDE: 102 mmol/L (ref 101–111)
CO2: 30 mmol/L (ref 22–32)
Calcium: 9.2 mg/dL (ref 8.9–10.3)
Creatinine, Ser: 0.84 mg/dL (ref 0.61–1.24)
GFR calc non Af Amer: >60 mL/min (ref >60)
Glucose, Bld: 128 mg/dL — ABNORMAL HIGH (ref 65–99)
POTASSIUM: 3.9 mmol/L (ref 3.5–5.1)
SODIUM: 141 mmol/L (ref 135–145)

## 2016-01-29 LAB — ECHOCARDIOGRAM COMPLETE
Height: 68 in
Weight: 2552 oz

## 2016-01-29 MED ORDER — FUROSEMIDE 10 MG/ML IJ SOLN
40.0000 mg | Freq: Two times a day (BID) | INTRAMUSCULAR | Status: DC
  Administered 2016-01-29: 40 mg via INTRAVENOUS
  Filled 2016-01-29 (×2): qty 4

## 2016-01-29 NOTE — Progress Notes (Signed)
  Echocardiogram 2D Echocardiogram has been performed.  [name] Chang 01/29/2016, 2:21 PM

## 2016-01-29 NOTE — Progress Notes (Signed)
PROGRESS NOTE        PATIENT DETAILS Name: [name] Chang Age: 82 y.o. Sex: male Date of Birth: 12-08-1915 Admit Date: 01/27/2016 Admitting Physician Evalee Mutton Kristeen Mans, MD PCP:Pcp Not In System  Brief Narrative: Patient is a 82 y.o. male with medical history significant of CAD status post CABG, chronic diastolic heart failure, atrial fibrillation not on anticoagulation, thrombocytopenia who presented to the hospital for evaluation of worsening shortness of breath and leg edema-he was found to be significantly volume overloaded, and admitted to the hospital for acute on chronic diastolic heart failure.  Subjective: Much better this morning-claims he slept after a long time. Marked decrease in lower ext edema today.  Assessment/Plan: Acute on chronic diastolic heart failure: Significantly improved this morning, -3.2 L so far, weight decreased to 159 pounds. Still has some lower extremity edema-although this has improved quite a lot, will decrease IV Lasix to 40 mg twice a day, and plan on discharge tomorrow if clinical improvement continues.  Essential hypertension: Continue metoprolol and Cardizem. Follow BP trend  Chronic Atrial fibrillation: Rate controlled with Cardizem and metoprolol. Although he has a chads2vasc of at least 3, he is not a candidate for anticoagulation given advanced age, frailty, fall risk. Cautiously continue with aspirin-has baseline thrombocytopenia.  History of thrombocytopenia: Chronic issue-he may have a underlying bone marrow issue given advanced age. Cautiously continue with aspirin-doubt further intervention including a bone marrow biopsy will change outcome or management. Follow platelet count periodically  Coronary atherosclerosis: Cautiously continue with aspirin and beta blocker. Given his advanced age and frailty-we will not cycle troponins-as he will not be a candidate for any further intervention.  Hx of PROSTATE  CANCER   Chronic debility/deconditioning: PT/OT eval-we will likely require home health services on discharge. Patient lives with his daughter.  DVT Prophylaxis: Maryln Manuel  Code Status:  DNR  Family Communication: None at bedside-left message for patient's daughter in her voicemail  Disposition Plan: Remain inpatient-but will plan on Home health vs SNF on discharge  Antimicrobial agents: None  Procedures: None  CONSULTS:  None  Time spent: 25 minutes-Greater than 50% of this time was spent in counseling, explanation of diagnosis, planning of further management, and coordination of care.  MEDICATIONS: Anti-infectives    None      Scheduled Meds: . aspirin  81 mg Oral Daily  . atorvastatin  10 mg Oral Daily  . diltiazem  60 mg Oral BID  . furosemide  40 mg Intravenous BID  . metoprolol  25 mg Oral BID  . mirtazapine  7.5 mg Oral QHS  . multivitamin  1 tablet Oral BID  . omega-3 acid ethyl esters  1,000 mg Oral Daily  . polyethylene glycol  17 g Oral Daily  . potassium chloride  40 mEq Oral BID  . sodium chloride flush  3 mL Intravenous Q12H   Continuous Infusions: PRN Meds:.sodium chloride, acetaminophen, ondansetron (ZOFRAN) IV, sodium chloride flush, traMADol   PHYSICAL EXAM: Vital signs: Vitals:   01/28/16 1220 01/28/16 2036 01/29/16 0511 01/29/16 0911  BP: 138/78 (!) 143/99 (!) 145/90 127/88  Pulse: 76 88 90 78  Resp: '18 20 18   '$ Temp: 97.8 F (36.6 C) 97.8 F (36.6 C) 98 F (36.7 C)   TempSrc: Oral Oral Oral   SpO2: 95% 94% 94%   Weight:   72.3 kg (159 lb  8 oz)   Height:       Filed Weights   01/27/16 2056 01/28/16 0700 01/29/16 0511  Weight: 74.9 kg (165 lb 1.6 oz) 74.4 kg (164 lb 1.6 oz) 72.3 kg (159 lb 8 oz)   Body mass index is 24.25 kg/m.   General appearance :Awake, alert, not in any distress. Speech Clear. Not toxic Looking Eyes:, pupils equally reactive to light and accomodation HEENT: Atraumatic and Normocephalic Neck:  supple, Resp:Good air entry bilaterally-Very few rales today. CVS: S1 S2 irregular GI: Bowel sounds present, Non tender and not distended with no gaurding, rigidity or rebound.No organomegaly Extremities: B/L Lower Ext shows + edema, both legs are warm to touch Neurology:  speech clear,Non focal Musculoskeletal:No digital cyanosis Skin:No Rash, warm and dry Wounds:N/A  I have personally reviewed following labs and imaging studies  LABORATORY DATA: CBC:  Recent Labs Lab 01/27/16 1503 01/28/16 0314  WBC 7.6 9.5  NEUTROABS 6.0 7.7  HGB 14.1 13.3  HCT 43.7 41.7  MCV 97.5 98.1  PLT 63* 67*    Basic Metabolic Panel:  Recent Labs Lab 01/27/16 1503 01/28/16 0314 01/29/16 0415  NA 144 142 141  K 3.5 3.4* 3.9  CL 107 104 102  CO2 '27 26 30  '$ GLUCOSE 121* 138* 128*  BUN '17 15 15  '$ CREATININE 0.79 0.79 0.84  CALCIUM 9.2 8.8* 9.2    GFR: Estimated Creatinine Clearance: 47.5 mL/min (by C-G formula based on SCr of 0.84 mg/dL).  Liver Function Tests:  Recent Labs Lab 01/27/16 1503  AST 23  ALT 13*  ALKPHOS 69  BILITOT 1.5*  PROT 6.7  ALBUMIN 3.8   No results for input(s): LIPASE, AMYLASE in the last 168 hours. No results for input(s): AMMONIA in the last 168 hours.  Coagulation Profile: No results for input(s): INR, PROTIME in the last 168 hours.  Cardiac Enzymes:  Recent Labs Lab 01/27/16 1503  TROPONINI 0.03*    BNP (last 3 results) No results for input(s): PROBNP in the last 8760 hours.  HbA1C: No results for input(s): HGBA1C in the last 72 hours.  CBG:  Recent Labs Lab 01/27/16 2251  GLUCAP 124*    Lipid Profile: No results for input(s): CHOL, HDL, LDLCALC, TRIG, CHOLHDL, LDLDIRECT in the last 72 hours.  Thyroid Function Tests: No results for input(s): TSH, T4TOTAL, FREET4, T3FREE, THYROIDAB in the last 72 hours.  Anemia Panel: No results for input(s): VITAMINB12, FOLATE, FERRITIN, TIBC, IRON, RETICCTPCT in the last 72 hours.  Urine  analysis:    Component Value Date/Time   COLORURINE YELLOW 01/27/2016 [place] 01/27/2016 1642   LABSPEC 1.012 01/27/2016 1642   PHURINE 5.0 01/27/2016 1642   GLUCOSEU NEGATIVE 01/27/2016 1642   HGBUR SMALL (A) 01/27/2016 1642   BILIRUBINUR NEGATIVE 01/27/2016 1642   KETONESUR NEGATIVE 01/27/2016 1642   PROTEINUR 30 (A) 01/27/2016 1642   UROBILINOGEN 2.0 (H) 07/29/2006 1126   NITRITE NEGATIVE 01/27/2016 1642   LEUKOCYTESUR NEGATIVE 01/27/2016 1642    Sepsis Labs: Lactic Acid, Venous No results found for: LATICACIDVEN  MICROBIOLOGY: No results found for this or any previous visit (from the past 240 hour(s)).  RADIOLOGY STUDIES/RESULTS: Dg Chest 2 View  Result Date: 01/27/2016 CLINICAL DATA:  Shortness of Breath EXAM: CHEST  2 VIEW COMPARISON:  11/29/2015 FINDINGS: Cardiomegaly is noted. There is small to moderate bilateral pleural effusion with bilateral basilar atelectasis or infiltrate. Status post CABG. Osteopenia and mild degenerative change thoracic spine. No pulmonary edema. IMPRESSION: Small to moderate  bilateral pleural effusion with bilateral basilar atelectasis or infiltrate. Status post CABG. No pulmonary edema. Electronically Signed   By: Lahoma Crocker M.D.   On: 01/27/2016 14:56     LOS: 2 days   Oren Binet, MD  [facility] Pager:[phone]  If 7PM-7AM, please contact night-coverage [email or web address] Password [email or web address] 01/29/2016, 10:05 AM

## 2016-01-30 LAB — BASIC METABOLIC PANEL
Anion gap: 12 (ref 5–15)
BUN: 24 mg/dL — AB (ref 6–20)
CHLORIDE: 100 mmol/L — AB (ref 101–111)
CO2: 27 mmol/L (ref 22–32)
CREATININE: 1.13 mg/dL (ref 0.61–1.24)
Calcium: 9.1 mg/dL (ref 8.9–10.3)
GFR calc Af Amer: >60 mL/min (ref >60)
GFR, EST NON AFRICAN AMERICAN: 52 mL/min — AB (ref >60)
GLUCOSE: 154 mg/dL — AB (ref 65–99)
Potassium: 4.7 mmol/L (ref 3.5–5.1)
SODIUM: 139 mmol/L (ref 135–145)

## 2016-01-30 MED ORDER — POTASSIUM CHLORIDE CRYS ER 20 MEQ PO TBCR: 20.0000 meq | EXTENDED_RELEASE_TABLET | Freq: Every day | ORAL | 0 refills | Status: AC

## 2016-01-30 MED ORDER — POTASSIUM CHLORIDE CRYS ER 20 MEQ PO TBCR
20.0000 meq | EXTENDED_RELEASE_TABLET | Freq: Every day | ORAL | Status: DC
  Administered 2016-01-30: 20 meq via ORAL
  Filled 2016-01-30: qty 1

## 2016-01-30 MED ORDER — FUROSEMIDE 40 MG PO TABS: 40.0000 mg | ORAL_TABLET | Freq: Every day | ORAL | 0 refills | Status: AC

## 2016-01-30 MED ORDER — FUROSEMIDE 40 MG PO TABS
40.0000 mg | ORAL_TABLET | Freq: Every day | ORAL | Status: DC
  Filled 2016-01-30: qty 1

## 2016-01-30 NOTE — Progress Notes (Signed)
Patient has times when he gets up and gets very anxious when he wants to talk to daughter. He called his daughter and she is aware.

## 2016-01-30 NOTE — Progress Notes (Signed)
SATURATION QUALIFICATIONS: (This note is used to comply with regulatory documentation for home oxygen)  Patient Saturations on Room Air at Rest = 87%      

## 2016-01-30 NOTE — Progress Notes (Signed)
Patient really confused throughout the night, confusion usually resolves, but he still seems a little different. Vitals are stable. Oxygen was off when I entered room. O2 sats were at 87%, turned oxygen up to 4L (from 3L) and had patient take a few deep breaths, oxygen improved. Patient took nasal cannula off a few times during the night, after adjusting him and putting the nasal cannula back on, patient was comfortable.

## 2016-01-30 NOTE — Discharge Summary (Signed)
PATIENT DETAILS Name: [name] Chang Age: 82 y.o. Sex: male Date of Birth: [birth year] MRN: [id number]. Admitting Physician: Jonetta Osgood, MD PCP:Pcp Not In System  Admit Date: 01/27/2016 Discharge date: 01/30/2016  Recommendations for Outpatient Follow-up:  1. Follow up with PCP in 1-2 weeks 2. Please obtain BMP/CBC in one week 3. Consider initiation of hospice services at some point in the future.  Admitted From:  Home  Disposition: Home with home health services   Home Health:  Yes/  Equipment/Devices: Oxygen 3L  Discharge Condition: Guarded  CODE STATUS: DNR  Diet recommendation:  Heart Healthy  Brief Summary: See H&P, Labs, Consult and Test reports for all details in brief, Patient is a 82 y.o. male with medical history significant ofCAD status post CABG, chronic diastolic heart failure, atrial fibrillation not on anticoagulation, thrombocytopenia who presented to the hospital for evaluation of worsening shortness of breath and leg edema-he was found to be significantly volume overloaded, and admitted to the hospital for acute on chronic diastolic heart failure.  Brief Hospital Course: Acute on chronic diastolic heart failure: Significantly improved this morning, -3.1 L so far, weight decreased to 157 pounds-which is his dry weight. He has now much more compensated, with very little trace in his lower extremities. We will change Lasix to 40 mg daily and discharge home today. Echocardiogram showed EF around 50-55%.   Acute on chronic hypoxemic respiratory failure: Suspect he has chronic hypoxemic respiratory failure at baseline due to ongoing CHF-this was worsened while he was in the hospital-he required oxygen supplementation. On discharge, he will be divided oxygen approximately 3 L/m. On day of discharge-on room air his O2 saturation was around 87%, and with around 2-3 L of supplementation this increases to 91-92%. Even his advanced age him a frailty- He is not  a candidate for further workup of his hypoxemic respiratory failure to determine if there is any other etiology.  Delirium: I suspect he has mild dementia/cognitive dysfunction at baseline-and he gets confused at nights. He apparently has done the same thing in his prior hospitalization as well-this was confirmed with the patient's daughter over the phone. On top of that, it looks like he is having issues with his hearing aid-making things even worse. I had a long conversation with the patient's daughter over the phone today, we agreed that patient would best benefit from being back in his familiar surroundings. Patient's daughter requests that we provide oxygen, maximum home health services and she would like to take him home and do the best she can.  Essential hypertensionContinue metoprolol and Cardizem. Follow BP trend  Chronic Atrial fibrillation:Rate controlled with Cardizem and metoprolol. Although he has a chads2vasc of at least 3, he is not a candidate for anticoagulation given advanced age, frailty, fall risk. Cautiously continue with aspirin-has baseline thrombocytopenia.  History of thrombocytopenia:Chronic issue-he may have a underlying bone marrow issue given advanced age. Cautiously continue with aspirin-doubt further intervention including a bone marrow biopsy will change outcome or management. Follow platelet count periodically  Coronary atherosclerosis: Cautiously continue with aspirin and beta blocker. Given his advanced age and frailty-we will not cycle troponins-as he will not be a candidate for any further intervention.  Moderate to severe aortic stenosis/moderate mitral regurgitation: Clearly not a candidate for any intervention. Spoke with daughter and informed her of above, she is aware that her father is not a candidate for any sort of intervention at this time. Ifthe patient deteriorates in the future, I suspect he is appropriate  for initiation of hospice care.  Severe  pulmonary hypertension: Probably secondary to left-sided heart failure, valvular abnormalities. Not a candidate for further intervention. As noted above, if patient deteriorates, is appropriate to start initiation of hospice care.  Hx of PROSTATE CANCER  Chronic debility/deconditioning:PT/OT eval completed, although recommendations were for SNF, patient at this time refuses SNF placement-spoke with patient's daughter, she is agreeable to take him home with maximum home health services.   Palliative care: DO NOT RESUSCITATE in place. Very frail 82 year old with significant above issues-admitted with acute on chronic diastolic heart failure. His echocardiogram demonstrated severe aortic stenosis, moderate aortic regurgitation and severe pulmonary hypertension. He will be discharged home on oxygen supplementation. Because of his frailty and advanced age, he is not a candidate for further intervention, his daughter agrees. Family wants to keep him home as much as possible, goals are mostly for comfort. I suspect at some point patient will benefit from initiation of home hospice services. I will defer to PCP to follow his clinical course and start hospice services when appropriate.  Procedures/Studies: None  Discharge Diagnoses:  Active Problems:   PROSTATE CANCER   Essential hypertension   Coronary atherosclerosis   Atrial fibrillation (HCC)   Acute diastolic heart failure [facility])   Discharge Instructions:  Activity:  As tolerated with Full fall precautions use walker/cane & assistance as needed  Discharge Instructions    (HEART FAILURE PATIENTS) Call MD:  Anytime you have any of the following symptoms: 1) 3 pound weight gain in 24 hours or 5 pounds in 1 week 2) shortness of breath, with or without a dry hacking cough 3) swelling in the hands, feet or stomach 4) if you have to sleep on extra pillows at night in order to breathe.    Complete by:  As directed    Diet - low sodium heart healthy     Complete by:  As directed    Discharge instructions    Complete by:  As directed    You have Congestive Heart Failure: Please call your Cardiologist or Primary MD-Anytime you have any of the following symptoms:   1) 3 pound weight gain in 24 hours or 5 pounds in 1 week  2) shortness of breath, with or without a dry hacking cough 3) swelling in the hands, feet or stomach 4) if you have to sleep on extra pillows at night in order to breathe  Follow cardiac low salt diet and 1.5 lit/day fluid restriction.   Follow with Primary MD  In 1 week  Please get a complete blood count and chemistry panel checked by your Primary MD at your next visit, and again as instructed by your Primary MD.  Get Medicines reviewed and adjusted: Please take all your medications with you for your next visit with your Primary MD  Laboratory/radiological data: Please request your Primary MD to go over all hospital tests and procedure/radiological results at the follow up, please ask your Primary MD to get all Hospital records sent to his/her office.  In some cases, they will be blood work, cultures and biopsy results pending at the time of your discharge. Please request that your primary care M.D. follows up on these results.  Also Note the following: If you experience worsening of your admission symptoms, develop shortness of breath, life threatening emergency, suicidal or homicidal thoughts you must seek medical attention immediately by calling 911 or calling your MD immediately  if symptoms less severe.  You must read complete instructions/literature along  with all the possible adverse reactions/side effects for all the Medicines you take and that have been prescribed to you. Take any new Medicines after you have completely understood and accpet all the possible adverse reactions/side effects.   Do not drive when taking Pain medications or sleeping medications (Benzodaizepines)  Do not take more than  prescribed Pain, Sleep and Anxiety Medications. It is not advisable to combine anxiety,sleep and pain medications without talking with your primary care practitioner  Special Instructions: If you have smoked or chewed Tobacco  in the last 2 yrs please stop smoking, stop any regular Alcohol  and or any Recreational drug use.  Wear Seat belts while driving.  Please note: You were cared for by a hospitalist during your hospital stay. Once you are discharged, your primary care physician will handle any further medical issues. Please note that NO REFILLS for any discharge medications will be authorized once you are discharged, as it is imperative that you return to your primary care physician (or establish a relationship with a primary care physician if you do not have one) for your post hospital discharge needs so that they can reassess your need for medications and monitor your lab values.   Heart Failure patients record your daily weight using the same scale at the same time of day    Complete by:  As directed    Increase activity slowly    Complete by:  As directed        Medication List    TAKE these medications   aspirin 81 MG chewable tablet Chew 1 tablet (81 mg total) by mouth daily.   atorvastatin 10 MG tablet Commonly known as:  LIPITOR Take 10 mg by mouth daily.   D-3-5 5000 units capsule Generic drug:  Cholecalciferol Take 5,000 Units by mouth daily.   diltiazem 60 MG tablet Commonly known as:  CARDIZEM Take 60 mg by mouth 2 (two) times daily.   docusate sodium 100 MG capsule Commonly known as:  COLACE Take 100 mg by mouth daily as needed for mild constipation.   furosemide 40 MG tablet Commonly known as:  LASIX Take 1 tablet (40 mg total) by mouth daily.   glucosamine-chondroitin 500-400 MG tablet Take 1 tablet by mouth 2 (two) times daily.   Melatonin 5 MG Tabs Take 5 mg by mouth at bedtime as needed (sleep).   meloxicam 15 MG tablet Commonly known as:   MOBIC Take 15 mg by mouth daily as needed for pain.   metoprolol 50 MG tablet Commonly known as:  LOPRESSOR Take 25 mg by mouth 2 (two) times daily.   mirtazapine 7.5 MG tablet Commonly known as:  REMERON Take 7.5 mg by mouth at bedtime.   multivitamin with minerals Tabs tablet Take 1 tablet by mouth daily.   Omega 3 1200 MG Caps Take 1,200 mg by mouth daily.   polyethylene glycol packet Commonly known as:  MIRALAX / GLYCOLAX Take 17 g by mouth daily as needed for moderate constipation.   potassium chloride SA 20 MEQ tablet Commonly known as:  K-DUR,KLOR-CON Take 1 tablet (20 mEq total) by mouth daily.   PRESERVISION AREDS 2 Caps Take 1 tablet by mouth 2 (two) times daily.   traMADol 50 MG tablet Commonly known as:  ULTRAM Take 50-100 mg by mouth every 12 (twelve) hours as needed for severe pain.            Durable Medical Equipment        Start  Ordered   01/30/16 0817  For home use only DME oxygen  Once    Question Answer Comment  Mode or (Route) Nasal cannula   Liters per Minute 2   Frequency Continuous (stationary and portable oxygen unit needed)   Oxygen conserving device Yes   Oxygen delivery system Gas      01/30/16 0816     Follow-up Information    Horatio Pel, MD. Schedule an appointment as soon as possible for a visit in 1 week(s).   Specialty:  Internal Medicine Contact information: [address] [place] [address] [phone]          No Known Allergies  Consultations:   None  Other Procedures/Studies: Dg Chest 2 View  Result Date: 01/27/2016 CLINICAL DATA:  Shortness of Breath EXAM: CHEST  2 VIEW COMPARISON:  11/29/2015 FINDINGS: Cardiomegaly is noted. There is small to moderate bilateral pleural effusion with bilateral basilar atelectasis or infiltrate. Status post CABG. Osteopenia and mild degenerative change thoracic spine. No pulmonary edema. IMPRESSION: Small to moderate bilateral pleural  effusion with bilateral basilar atelectasis or infiltrate. Status post CABG. No pulmonary edema. Electronically Signed   By: Lahoma Crocker M.D.   On: 01/27/2016 14:56      TODAY-DAY OF DISCHARGE:  Subjective:   Jarold Motto today Is slightly confused-he is very hard of hearing which exacerbates this confusion. He claims that his shortness of breath is much better, he hardly has any leg edema.  Objective:   Blood pressure 107/69, pulse 96, temperature 97.7 F (36.5 C), temperature source Oral, resp. rate 16, height '5\' 8"'$  (1.727 m), weight 71.5 kg (157 lb 11.2 oz), SpO2 90 %.  Intake/Output Summary (Last 24 hours) at 01/30/16 4315 Last data filed at 01/30/16 0600  Gross per 24 hour  Intake              786 ml  Output              601 ml  Net              185 ml   Filed Weights   01/28/16 0700 01/29/16 0511 01/30/16 0606  Weight: 74.4 kg (164 lb 1.6 oz) 72.3 kg (159 lb 8 oz) 71.5 kg (157 lb 11.2 oz)    Exam: AwakeAnd slightly confused   No new F.N deficits, Normal affect [place].AT,PERRAL Supple Neck,No JVD, No cervical lymphadenopathy appriciated.  Symmetrical Chest wall movement, Good air movement bilaterally, CTAB RRR,No Gallops,Rubs or new Murmurs, No Parasternal Heave +ve B.Sounds, Abd Soft, Non tender, No organomegaly appriciated, No rebound -guarding or rigidity. No Cyanosis, Clubbing or edema, No new Rash or bruise   PERTINENT RADIOLOGIC STUDIES: Dg Chest 2 View  Result Date: 01/27/2016 CLINICAL DATA:  Shortness of Breath EXAM: CHEST  2 VIEW COMPARISON:  11/29/2015 FINDINGS: Cardiomegaly is noted. There is small to moderate bilateral pleural effusion with bilateral basilar atelectasis or infiltrate. Status post CABG. Osteopenia and mild degenerative change thoracic spine. No pulmonary edema. IMPRESSION: Small to moderate bilateral pleural effusion with bilateral basilar atelectasis or infiltrate. Status post CABG. No pulmonary edema. Electronically Signed   By: Lahoma Crocker M.D.    On: 01/27/2016 14:56     PERTINENT LAB RESULTS: CBC:  Recent Labs  01/27/16 1503 01/28/16 0314  WBC 7.6 9.5  HGB 14.1 13.3  HCT 43.7 41.7  PLT 63* 67*   CMET CMP     Component Value Date/Time   NA 139 01/30/2016 0505   K  4.7 01/30/2016 0505   CL 100 (L) 01/30/2016 0505   CO2 27 01/30/2016 0505   GLUCOSE 154 (H) 01/30/2016 0505   BUN 24 (H) 01/30/2016 0505   CREATININE 1.13 01/30/2016 0505   CALCIUM 9.1 01/30/2016 0505   PROT 6.7 01/27/2016 1503   ALBUMIN 3.8 01/27/2016 1503   AST 23 01/27/2016 1503   ALT 13 (L) 01/27/2016 1503   ALKPHOS 69 01/27/2016 1503   BILITOT 1.5 (H) 01/27/2016 1503   GFRNONAA 52 (L) 01/30/2016 0505   GFRAA >60 01/30/2016 0505    GFR Estimated Creatinine Clearance: 35.3 mL/min (by C-G formula based on SCr of 1.13 mg/dL). No results for input(s): LIPASE, AMYLASE in the last 72 hours.  Recent Labs  01/27/16 1503  TROPONINI 0.03*   Invalid input(s): POCBNP No results for input(s): DDIMER in the last 72 hours. No results for input(s): HGBA1C in the last 72 hours. No results for input(s): CHOL, HDL, LDLCALC, TRIG, CHOLHDL, LDLDIRECT in the last 72 hours. No results for input(s): TSH, T4TOTAL, T3FREE, THYROIDAB in the last 72 hours.  Invalid input(s): FREET3 No results for input(s): VITAMINB12, FOLATE, FERRITIN, TIBC, IRON, RETICCTPCT in the last 72 hours. Coags: No results for input(s): INR in the last 72 hours.  Invalid input(s): PT Microbiology: No results found for this or any previous visit (from the past 240 hour(s)).  FURTHER DISCHARGE INSTRUCTIONS:  Get Medicines reviewed and adjusted: Please take all your medications with you for your next visit with your Primary MD  Laboratory/radiological data: Please request your Primary MD to go over all hospital tests and procedure/radiological results at the follow up, please ask your Primary MD to get all Hospital records sent to his/her office.  In some cases, they will be blood  work, cultures and biopsy results pending at the time of your discharge. Please request that your primary care M.D. goes through all the records of your hospital data and follows up on these results.  Also Note the following: If you experience worsening of your admission symptoms, develop shortness of breath, life threatening emergency, suicidal or homicidal thoughts you must seek medical attention immediately by calling 911 or calling your MD immediately  if symptoms less severe.  You must read complete instructions/literature along with all the possible adverse reactions/side effects for all the Medicines you take and that have been prescribed to you. Take any new Medicines after you have completely understood and accpet all the possible adverse reactions/side effects.   Do not drive when taking Pain medications or sleeping medications (Benzodaizepines)  Do not take more than prescribed Pain, Sleep and Anxiety Medications. It is not advisable to combine anxiety,sleep and pain medications without talking with your primary care practitioner  Special Instructions: If you have smoked or chewed Tobacco  in the last 2 yrs please stop smoking, stop any regular Alcohol  and or any Recreational drug use.  Wear Seat belts while driving.  Please note: You were cared for by a hospitalist during your hospital stay. Once you are discharged, your primary care physician will handle any further medical issues. Please note that NO REFILLS for any discharge medications will be authorized once you are discharged, as it is imperative that you return to your primary care physician (or establish a relationship with a primary care physician if you do not have one) for your post hospital discharge needs so that they can reassess your need for medications and monitor your lab values.  Total Time spent coordinating discharge including counseling,  education and face to face time equals  45  minutes.  SignedOren Binet 01/30/2016 8:22 AM

## 2016-01-30 NOTE — Care Management Note (Addendum)
Case Management Note  Patient Details  Name: [name] Chang MRN: [id number] Date of Birth: [birth year]  Subjective/Objective:                  Acute on chronic diastolic heart failure Action/Plan: Discharge planning Expected Discharge Date:  01/30/16               Expected Discharge Plan:  [name]  In-House Referral:     Discharge planning Services  CM Consult  Post Acute Care Choice:  Home Health Choice offered to:  Patient, Adult Children  DME Arranged:  Oxygen DME Agency:  [place] Arranged:  RN, PT, Nurse's Aide [name] Agency:  [place]  Status of Service:  Completed, signed off  If discussed at [place] of Stay Meetings, dates discussed:    Additional Comments: CM met with pt and daughter, [name] Chang, who is best contact [phone] in room to offer choice of home health agency.  Pt chooses [facility] to render HHPT/RN/aide.  Referral called to [facility] rep, [name].  CM called [facility] DME rep, [name] to please deliver the O2 tank to room for transport home.  [name] will be providing transportation home. No other CM needs were communicated. [name], RN 01/30/2016, 10:26 AM

## 2016-01-30 NOTE — Progress Notes (Signed)
Patient discharge with DME oxygen tank.[name] Chang

## 2016-01-30 NOTE — Progress Notes (Signed)
Patient is discharge to home accompanied by patient's daughter and NT via wheelchair. Discharge instructions given . Patient and patient daughter verbalizes understanding.  Patient stable. No signs and symptoms of distress noted. All personal belongings given. Telemetry box and IV removed prior to discharge.

## 2016-01-31 DIAGNOSIS — J449 Chronic obstructive pulmonary disease, unspecified: Secondary | ICD-10-CM | POA: Diagnosis not present

## 2016-01-31 DIAGNOSIS — Z951 Presence of aortocoronary bypass graft: Secondary | ICD-10-CM | POA: Diagnosis not present

## 2016-01-31 DIAGNOSIS — Z96642 Presence of left artificial hip joint: Secondary | ICD-10-CM | POA: Diagnosis not present

## 2016-01-31 DIAGNOSIS — I4891 Unspecified atrial fibrillation: Secondary | ICD-10-CM | POA: Diagnosis not present

## 2016-01-31 DIAGNOSIS — Z79891 Long term (current) use of opiate analgesic: Secondary | ICD-10-CM | POA: Diagnosis not present

## 2016-01-31 DIAGNOSIS — E785 Hyperlipidemia, unspecified: Secondary | ICD-10-CM | POA: Diagnosis not present

## 2016-01-31 DIAGNOSIS — I5032 Chronic diastolic (congestive) heart failure: Secondary | ICD-10-CM | POA: Diagnosis not present

## 2016-01-31 DIAGNOSIS — I110 Hypertensive heart disease with heart failure: Secondary | ICD-10-CM | POA: Diagnosis not present

## 2016-01-31 DIAGNOSIS — Z8719 Personal history of other diseases of the digestive system: Secondary | ICD-10-CM | POA: Diagnosis not present

## 2016-01-31 DIAGNOSIS — M1990 Unspecified osteoarthritis, unspecified site: Secondary | ICD-10-CM | POA: Diagnosis not present

## 2016-01-31 DIAGNOSIS — I2510 Atherosclerotic heart disease of native coronary artery without angina pectoris: Secondary | ICD-10-CM | POA: Diagnosis not present

## 2016-02-01 DIAGNOSIS — I4891 Unspecified atrial fibrillation: Secondary | ICD-10-CM | POA: Diagnosis not present

## 2016-02-01 DIAGNOSIS — J449 Chronic obstructive pulmonary disease, unspecified: Secondary | ICD-10-CM | POA: Diagnosis not present

## 2016-02-01 DIAGNOSIS — Z96642 Presence of left artificial hip joint: Secondary | ICD-10-CM | POA: Diagnosis not present

## 2016-02-01 DIAGNOSIS — I110 Hypertensive heart disease with heart failure: Secondary | ICD-10-CM | POA: Diagnosis not present

## 2016-02-01 DIAGNOSIS — Z951 Presence of aortocoronary bypass graft: Secondary | ICD-10-CM | POA: Diagnosis not present

## 2016-02-01 DIAGNOSIS — M1990 Unspecified osteoarthritis, unspecified site: Secondary | ICD-10-CM | POA: Diagnosis not present

## 2016-02-01 DIAGNOSIS — Z79891 Long term (current) use of opiate analgesic: Secondary | ICD-10-CM | POA: Diagnosis not present

## 2016-02-01 DIAGNOSIS — E785 Hyperlipidemia, unspecified: Secondary | ICD-10-CM | POA: Diagnosis not present

## 2016-02-01 DIAGNOSIS — I2510 Atherosclerotic heart disease of native coronary artery without angina pectoris: Secondary | ICD-10-CM | POA: Diagnosis not present

## 2016-02-01 DIAGNOSIS — I5022 Chronic systolic (congestive) heart failure: Secondary | ICD-10-CM | POA: Diagnosis not present

## 2016-02-01 DIAGNOSIS — I5032 Chronic diastolic (congestive) heart failure: Secondary | ICD-10-CM | POA: Diagnosis not present

## 2016-02-01 DIAGNOSIS — Z8719 Personal history of other diseases of the digestive system: Secondary | ICD-10-CM | POA: Diagnosis not present

## 2016-02-02 DIAGNOSIS — Z96642 Presence of left artificial hip joint: Secondary | ICD-10-CM | POA: Diagnosis not present

## 2016-02-02 DIAGNOSIS — I110 Hypertensive heart disease with heart failure: Secondary | ICD-10-CM | POA: Diagnosis not present

## 2016-02-02 DIAGNOSIS — E785 Hyperlipidemia, unspecified: Secondary | ICD-10-CM | POA: Diagnosis not present

## 2016-02-02 DIAGNOSIS — I4891 Unspecified atrial fibrillation: Secondary | ICD-10-CM | POA: Diagnosis not present

## 2016-02-02 DIAGNOSIS — M1990 Unspecified osteoarthritis, unspecified site: Secondary | ICD-10-CM | POA: Diagnosis not present

## 2016-02-02 DIAGNOSIS — Z951 Presence of aortocoronary bypass graft: Secondary | ICD-10-CM | POA: Diagnosis not present

## 2016-02-02 DIAGNOSIS — Z79891 Long term (current) use of opiate analgesic: Secondary | ICD-10-CM | POA: Diagnosis not present

## 2016-02-02 DIAGNOSIS — I2510 Atherosclerotic heart disease of native coronary artery without angina pectoris: Secondary | ICD-10-CM | POA: Diagnosis not present

## 2016-02-02 DIAGNOSIS — I5032 Chronic diastolic (congestive) heart failure: Secondary | ICD-10-CM | POA: Diagnosis not present

## 2016-02-02 DIAGNOSIS — J449 Chronic obstructive pulmonary disease, unspecified: Secondary | ICD-10-CM | POA: Diagnosis not present

## 2016-02-02 DIAGNOSIS — Z8719 Personal history of other diseases of the digestive system: Secondary | ICD-10-CM | POA: Diagnosis not present

## 2016-02-03 DIAGNOSIS — I5031 Acute diastolic (congestive) heart failure: Secondary | ICD-10-CM | POA: Diagnosis not present

## 2016-02-04 DIAGNOSIS — I110 Hypertensive heart disease with heart failure: Secondary | ICD-10-CM | POA: Diagnosis not present

## 2016-02-04 DIAGNOSIS — Z8719 Personal history of other diseases of the digestive system: Secondary | ICD-10-CM | POA: Diagnosis not present

## 2016-02-04 DIAGNOSIS — E785 Hyperlipidemia, unspecified: Secondary | ICD-10-CM | POA: Diagnosis not present

## 2016-02-04 DIAGNOSIS — I2510 Atherosclerotic heart disease of native coronary artery without angina pectoris: Secondary | ICD-10-CM | POA: Diagnosis not present

## 2016-02-04 DIAGNOSIS — Z951 Presence of aortocoronary bypass graft: Secondary | ICD-10-CM | POA: Diagnosis not present

## 2016-02-04 DIAGNOSIS — J449 Chronic obstructive pulmonary disease, unspecified: Secondary | ICD-10-CM | POA: Diagnosis not present

## 2016-02-04 DIAGNOSIS — I4891 Unspecified atrial fibrillation: Secondary | ICD-10-CM | POA: Diagnosis not present

## 2016-02-04 DIAGNOSIS — Z79891 Long term (current) use of opiate analgesic: Secondary | ICD-10-CM | POA: Diagnosis not present

## 2016-02-04 DIAGNOSIS — Z96642 Presence of left artificial hip joint: Secondary | ICD-10-CM | POA: Diagnosis not present

## 2016-02-04 DIAGNOSIS — I5032 Chronic diastolic (congestive) heart failure: Secondary | ICD-10-CM | POA: Diagnosis not present

## 2016-02-04 DIAGNOSIS — M1990 Unspecified osteoarthritis, unspecified site: Secondary | ICD-10-CM | POA: Diagnosis not present

## 2016-02-07 DIAGNOSIS — I2510 Atherosclerotic heart disease of native coronary artery without angina pectoris: Secondary | ICD-10-CM | POA: Diagnosis not present

## 2016-02-07 DIAGNOSIS — J449 Chronic obstructive pulmonary disease, unspecified: Secondary | ICD-10-CM | POA: Diagnosis not present

## 2016-02-07 DIAGNOSIS — I5032 Chronic diastolic (congestive) heart failure: Secondary | ICD-10-CM | POA: Diagnosis not present

## 2016-02-07 DIAGNOSIS — Z8719 Personal history of other diseases of the digestive system: Secondary | ICD-10-CM | POA: Diagnosis not present

## 2016-02-07 DIAGNOSIS — M1990 Unspecified osteoarthritis, unspecified site: Secondary | ICD-10-CM | POA: Diagnosis not present

## 2016-02-07 DIAGNOSIS — Z96642 Presence of left artificial hip joint: Secondary | ICD-10-CM | POA: Diagnosis not present

## 2016-02-07 DIAGNOSIS — I4891 Unspecified atrial fibrillation: Secondary | ICD-10-CM | POA: Diagnosis not present

## 2016-02-07 DIAGNOSIS — Z951 Presence of aortocoronary bypass graft: Secondary | ICD-10-CM | POA: Diagnosis not present

## 2016-02-07 DIAGNOSIS — I110 Hypertensive heart disease with heart failure: Secondary | ICD-10-CM | POA: Diagnosis not present

## 2016-02-07 DIAGNOSIS — E785 Hyperlipidemia, unspecified: Secondary | ICD-10-CM | POA: Diagnosis not present

## 2016-02-07 DIAGNOSIS — Z79891 Long term (current) use of opiate analgesic: Secondary | ICD-10-CM | POA: Diagnosis not present

## 2016-02-29 DIAGNOSIS — I5032 Chronic diastolic (congestive) heart failure: Secondary | ICD-10-CM | POA: Diagnosis not present

## 2016-05-28 DIAGNOSIS — 419620001 Death: Secondary | SNOMED CT | POA: Diagnosis not present

## 2016-05-28 DEATH — deceased
